# Patient Record
Sex: Female | Born: 1950 | Race: White | Hispanic: No | Smoking: Former smoker
Health system: Southern US, Community
[De-identification: ages and names within clinical notes are randomized; demographics above are authoritative.]

## PROBLEM LIST (undated history)

## (undated) DIAGNOSIS — J189 Pneumonia, unspecified organism: Secondary | ICD-10-CM

## (undated) DIAGNOSIS — E7800 Pure hypercholesterolemia, unspecified: Secondary | ICD-10-CM

## (undated) DIAGNOSIS — R51 Headache: Secondary | ICD-10-CM

## (undated) DIAGNOSIS — Z860100 Personal history of colon polyps, unspecified: Secondary | ICD-10-CM

## (undated) DIAGNOSIS — E785 Hyperlipidemia, unspecified: Secondary | ICD-10-CM

## (undated) DIAGNOSIS — M109 Gout, unspecified: Secondary | ICD-10-CM

## (undated) DIAGNOSIS — M940 Chondrocostal junction syndrome [Tietze]: Secondary | ICD-10-CM

## (undated) DIAGNOSIS — G588 Other specified mononeuropathies: Secondary | ICD-10-CM

## (undated) DIAGNOSIS — K5730 Diverticulosis of large intestine without perforation or abscess without bleeding: Secondary | ICD-10-CM

## (undated) DIAGNOSIS — T7840XA Allergy, unspecified, initial encounter: Secondary | ICD-10-CM

## (undated) DIAGNOSIS — K219 Gastro-esophageal reflux disease without esophagitis: Secondary | ICD-10-CM

## (undated) DIAGNOSIS — I10 Essential (primary) hypertension: Secondary | ICD-10-CM

## (undated) DIAGNOSIS — R7301 Impaired fasting glucose: Secondary | ICD-10-CM

## (undated) DIAGNOSIS — R7303 Prediabetes: Secondary | ICD-10-CM

## (undated) HISTORY — DX: Hyperlipidemia, unspecified: E78.5

## (undated) HISTORY — DX: Gout, unspecified: M10.9

## (undated) HISTORY — DX: Essential (primary) hypertension: I10

## (undated) HISTORY — DX: Pure hypercholesterolemia, unspecified: E78.00

## (undated) HISTORY — DX: Diverticulosis of large intestine without perforation or abscess without bleeding: K57.30

## (undated) HISTORY — DX: Allergy, unspecified, initial encounter: T78.40XA

## (undated) HISTORY — DX: Chondrocostal junction syndrome (tietze): M94.0

## (undated) HISTORY — DX: Morbid (severe) obesity due to excess calories: E66.01

## (undated) HISTORY — PX: CATARACT EXTRACTION, BILATERAL: SHX1313

## (undated) HISTORY — PX: TONSILLECTOMY: SUR1361

## (undated) HISTORY — PX: COLONOSCOPY: SHX174

## (undated) HISTORY — PX: LAPAROSCOPIC GASTRIC BANDING: SHX1100

## (undated) HISTORY — DX: Impaired fasting glucose: R73.01

## (undated) HISTORY — DX: Prediabetes: R73.03

## (undated) HISTORY — DX: Personal history of colon polyps, unspecified: Z86.0100

## (undated) HISTORY — PX: ANKLE FRACTURE SURGERY: SHX122

## (undated) HISTORY — DX: Other specified mononeuropathies: G58.8

---

## 1998-05-10 ENCOUNTER — Other Ambulatory Visit: Admission: RE | Admit: 1998-05-10 | Discharge: 1998-05-10 | Payer: Self-pay | Admitting: Obstetrics and Gynecology

## 1999-05-27 ENCOUNTER — Other Ambulatory Visit: Admission: RE | Admit: 1999-05-27 | Discharge: 1999-05-27 | Payer: Self-pay | Admitting: Obstetrics and Gynecology

## 1999-11-18 ENCOUNTER — Ambulatory Visit (HOSPITAL_COMMUNITY): Admission: RE | Admit: 1999-11-18 | Discharge: 1999-11-18 | Payer: Self-pay | Admitting: Gastroenterology

## 2000-06-02 ENCOUNTER — Other Ambulatory Visit: Admission: RE | Admit: 2000-06-02 | Discharge: 2000-06-02 | Payer: Self-pay | Admitting: Obstetrics and Gynecology

## 2001-06-07 ENCOUNTER — Other Ambulatory Visit: Admission: RE | Admit: 2001-06-07 | Discharge: 2001-06-07 | Payer: Self-pay | Admitting: Obstetrics and Gynecology

## 2002-07-11 ENCOUNTER — Other Ambulatory Visit: Admission: RE | Admit: 2002-07-11 | Discharge: 2002-07-11 | Payer: Self-pay | Admitting: Obstetrics and Gynecology

## 2002-10-01 ENCOUNTER — Encounter: Payer: Self-pay | Admitting: Orthopaedic Surgery

## 2002-10-01 ENCOUNTER — Inpatient Hospital Stay (HOSPITAL_COMMUNITY): Admission: EM | Admit: 2002-10-01 | Discharge: 2002-10-05 | Payer: Self-pay

## 2005-09-03 ENCOUNTER — Ambulatory Visit: Payer: Self-pay | Admitting: Pain Medicine

## 2006-12-21 ENCOUNTER — Ambulatory Visit (HOSPITAL_COMMUNITY): Admission: RE | Admit: 2006-12-21 | Discharge: 2006-12-21 | Payer: Self-pay | Admitting: Surgery

## 2006-12-23 ENCOUNTER — Encounter: Admission: RE | Admit: 2006-12-23 | Discharge: 2007-03-09 | Payer: Self-pay | Admitting: Surgery

## 2007-01-18 ENCOUNTER — Ambulatory Visit (HOSPITAL_COMMUNITY): Admission: RE | Admit: 2007-01-18 | Discharge: 2007-01-18 | Payer: Self-pay | Admitting: Surgery

## 2007-02-22 ENCOUNTER — Ambulatory Visit (HOSPITAL_COMMUNITY): Admission: RE | Admit: 2007-02-22 | Discharge: 2007-02-23 | Payer: Self-pay | Admitting: Surgery

## 2007-04-15 ENCOUNTER — Encounter: Admission: RE | Admit: 2007-04-15 | Discharge: 2007-06-09 | Payer: Self-pay | Admitting: Surgery

## 2007-09-22 ENCOUNTER — Encounter: Admission: RE | Admit: 2007-09-22 | Discharge: 2007-09-22 | Payer: Self-pay | Admitting: Obstetrics and Gynecology

## 2007-10-06 ENCOUNTER — Encounter: Admission: RE | Admit: 2007-10-06 | Discharge: 2007-10-06 | Payer: Self-pay | Admitting: Obstetrics and Gynecology

## 2007-10-06 ENCOUNTER — Other Ambulatory Visit: Admission: RE | Admit: 2007-10-06 | Discharge: 2007-10-06 | Payer: Self-pay | Admitting: Interventional Radiology

## 2007-10-06 ENCOUNTER — Encounter (INDEPENDENT_AMBULATORY_CARE_PROVIDER_SITE_OTHER): Payer: Self-pay | Admitting: Interventional Radiology

## 2007-11-22 ENCOUNTER — Encounter (INDEPENDENT_AMBULATORY_CARE_PROVIDER_SITE_OTHER): Payer: Self-pay | Admitting: Surgery

## 2007-11-22 ENCOUNTER — Ambulatory Visit (HOSPITAL_BASED_OUTPATIENT_CLINIC_OR_DEPARTMENT_OTHER): Admission: RE | Admit: 2007-11-22 | Discharge: 2007-11-22 | Payer: Self-pay | Admitting: Surgery

## 2007-12-15 ENCOUNTER — Encounter: Admission: RE | Admit: 2007-12-15 | Discharge: 2008-02-16 | Payer: Self-pay | Admitting: Surgery

## 2008-04-13 ENCOUNTER — Encounter: Admission: RE | Admit: 2008-04-13 | Discharge: 2008-04-13 | Payer: Self-pay | Admitting: Surgery

## 2010-06-18 NOTE — Op Note (Signed)
NAMECARMENCITA, [name]           ACCOUNT NO.:  [id number]   MEDICAL RECORD NO.:  [id number]          PATIENT TYPE:  OIB   LOCATION:  1534                         FACILITY:  [facility]   PHYSICIAN:  Thornton Park. Daphine Deutscher, MD  DATE OF BIRTH:  1950-06-07   DATE OF PROCEDURE:  02/22/2007  DATE OF DISCHARGE:                               OPERATIVE REPORT   PREOPERATIVE DIAGNOSIS:  Morbid obesity, body mass index 42.   POSTOPERATIVE DIAGNOSIS:  Morbid obesity, body mass index 42.   PROCEDURE:  Lap band APS system (Allergan)   SURGEON:  Thornton Park. Daphine Deutscher, MD.   ASSISTANTSharlet Salina T. Hoxworth, M.D.   ANESTHESIA:  GET.   DESCRIPTION OF PROCEDURE:  Ms. Wehrenberg is a 60 year old white female  brought to OR #1 on February 22, 2007 and given general anesthesia.  The  abdomen was prepped with Techni-Care and draped sterilely.  I entered  the abdomen through the left upper quadrant using an OptiVu 0-degree/10-  mm scope and entered and insufflated without difficulty.  Once  insufflated the standard trocars were placed including the 15 in the  upper right midline.  The abdomen was surveyed.  No other hernias or  defects were noted.  The 5-mm opening was made in the upper midline and  the [facility] retractor was inserted.  Dissection began at the opening  of the gastrohepatic window identifying the right crus.  I went and took  down an area right on the left crus.  The band passer was passed from  the inferior port up and out without difficulty and the APS band was  introduced through the 15-mm port.  It was then threaded through the  band passer brought around, the sizing balloon was inserted, pulled back  and there was resistance and no evidence of a hiatal hernia.  With the  band with it in place I went ahead and snapped the band and it seemed to  accommodate things just fine.  The tubing was then removed.   The anterior plication of the band was then performed using interrupted  free sutures of  2-0 Surgidac and were secured with tie knots.  Three  such sutures were placed in and the resultant plication looked good.  The band moved easily and was in good position.   The tubing to the band was brought out through the inferior port on the  right and attached to the Port-A-Cath port.  This was then threaded into  the abdomen and was secured to the fascia with four 2-0 Prolene's in an  interrupted  fashion secured to the fascia.  The wounds were irrigated and all closed  with 4-0 Vicryl subcutaneously and subcuticularly.  They were all  injected with 0.5% Marcaine.  Benzoin and Steri-Strips were used on the  skin.  The patient tolerated the procedure well and was taken to  recovery room in satisfactory condition.      Thornton Park Daphine Deutscher, MD  Electronically Signed     MBM/MEDQ  D:  02/22/2007  T:  02/23/2007  Job:  [id number]   cc:   Oley Balm. Georgina Pillion, M.D.  Fax: [phone]   Cordelia Pen A. Rosalio Macadamia, M.D.  Fax: [phone]

## 2010-06-21 NOTE — Procedures (Signed)
[facility]  Patient:    Rachel Brewer, Rachel Brewer                    MRN: [id number] Proc. Date: 11/18/99 Attending:  Everardo All. Madilyn Fireman, M.D. CC:         Oley Balm. Georgina Pillion, M.D.   Procedure Report  PROCEDURE:  Colonoscopy.  ENDOSCOPIST:  Everardo All. Madilyn Fireman, M.D.  INDICATION FOR PROCEDURE:  Family history of colon cancer in a first degree relative.  DESCRIPTION OF PROCEDURE:  The patient was placed in the left lateral decubitus position and placed on the pulse monitor with continuous low-flow oxygen delivered by nasal cannula.  She was sedated with 80 mg IV Demerol and 7 mg IV Versed.  The Olympus video colonoscope was inserted into the rectum and advanced to the cecum, confirmed by transillumination of McBurneys point and visualization of the ileocecal valve and appendiceal orifice.  The prep was fair.  There were areas where the walls were coated with some semisolid stool that could not be adequately washed away and I could thus not rule out lesions smaller than 1 cm in all areas; otherwise, the cecum, ascending, transverse, descending and sigmoid colon all appeared normal with no masses, polyps, diverticula or other mucosa abnormalities.  The rectum likewise appeared normal and a retroflexed view of the anus revealed no obvious internal hemorrhoids.  The colonoscope was then withdrawn and the patient returned to the recovery room in stable condition.  She tolerated the procedure well and there were no immediate complications.  IMPRESSION:  Internal hemorrhoids; otherwise, normal colonoscopy with suboptimal prep.  PLAN:  Repeat colonoscopy in five years. DD:  11/18/99 TD:  11/18/99 Job: [id number]

## 2010-06-21 NOTE — Discharge Summary (Signed)
NAME:  Rachel Brewer, Rachel Brewer                     ACCOUNT NO.:  [id number]   MEDICAL RECORD NO.:  [id number]                   PATIENT TYPE:  INP   LOCATION:  5007                                 FACILITY:  [facility]   PHYSICIAN:  Lubertha Basque. Jerl Santos, M.D.             DATE OF BIRTH:  04-25-1950   DATE OF ADMISSION:  10/01/2002  DATE OF DISCHARGE:  10/05/2002                                 DISCHARGE SUMMARY   ADMISSION DIAGNOSIS:  1. Bimalleolar ankle fracture, right.  2. History of hypertension.   DISCHARGE DIAGNOSES:  1. Bimalleolar ankle fracture, right.  2. History of hypertension.   PROCEDURE:  Open reduction, internal fixation of right bimalleolar ankle  fracture.   HISTORY OF PRESENT ILLNESS:  Briefly, the patient is a 60 year old white  female patient well known to our practice who has been treated for other  problems.  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  She was  walking on wet slippery ground and fell, suffering an injury to her right  ankle.  She was seen in the emergency room in that area and we were  contacted by the emergency room physician with the x-ray findings of a  bimalleolar ankle fracture and she was transported down to [place] where we met  in the emergency room and discussed treatment options, that being open  reduction and internal fixation of her ankle.   LABORATORY DATA:  Pertinent laboratory and x-ray findings with normal sinus  rhythm on electrocardiogram.  X-rays showed bimalleolar ankle fracture  displacement.  Chest with no active disease.  On labs the white blood cell  count was 11.3, red blood cells 4.86, hemoglobin 15, hematocrit 45.  Urine  was essentially normal.   HOSPITAL COURSE:  The patient was admitted postoperatively and placed on a  variety of p.o. and IM analgesics, intravenous Ancef 1 gram q.8h. X3 doses.  She was non weight-bearing on the right side and physical therapy and  occupational therapy were ordered for gait  training and non weight-bearing  gait.  Special equipment was ordered as well for a walker and 3-in-1 chair,  possibly a wheelchair.  The patient was very slow going with physical  therapy.  Her pain was controlled eventually on oral pain medications.  Her  equipment was ordered and home health was arranged.  She was discharged  home.   CONDITION ON DISCHARGE:  Improved.   FOLLOW UP:  The patient will remain on her current medications which are  Cardura, Avalide, Neurontin, amitriptyline, calcium, vitamin A.  She was  given a prescription for Percocet for pain, one to two tablets q.4-6h. PRN.  She is instructed to ice and to elevate.  Diet is regular.  No dressing  changes.  She should leave the splint on.  She will return to our office  within 7 to 10 days postoperatively.     Lindwood Qua, P.A.  Lubertha Basque Jerl Santos, M.D.   MC/MEDQ  D:  10/25/2002  T:  10/26/2002  Job:  [id number]

## 2010-09-19 ENCOUNTER — Encounter (INDEPENDENT_AMBULATORY_CARE_PROVIDER_SITE_OTHER): Payer: Self-pay | Admitting: Surgery

## 2010-09-20 ENCOUNTER — Encounter (INDEPENDENT_AMBULATORY_CARE_PROVIDER_SITE_OTHER): Payer: Self-pay | Admitting: Surgery

## 2010-09-20 ENCOUNTER — Ambulatory Visit (INDEPENDENT_AMBULATORY_CARE_PROVIDER_SITE_OTHER): Payer: Federal, State, Local not specified - PPO | Admitting: Surgery

## 2010-09-20 ENCOUNTER — Telehealth (INDEPENDENT_AMBULATORY_CARE_PROVIDER_SITE_OTHER): Payer: Self-pay | Admitting: Surgery

## 2010-09-20 VITALS — BP 148/92 | Ht 69.0 in | Wt 279.2 lb

## 2010-09-20 DIAGNOSIS — Z9884 Bariatric surgery status: Secondary | ICD-10-CM

## 2010-09-20 DIAGNOSIS — Z4651 Encounter for fitting and adjustment of gastric lap band: Secondary | ICD-10-CM

## 2010-09-20 NOTE — Progress Notes (Signed)
Rachel Brewer comes in today after a fun summer including [place], [address], and the mountains. We took out 2 cc before she left for the summer.  Therefore went ahead and added back I will more than or 2 cc today. Dated the computer. She's ready get back to try to lose weight again. We'll see her back in 6 weeks sooner if she wants to lose a little quarter.postop

## 2010-10-24 LAB — BASIC METABOLIC PANEL
BUN: 15
Calcium: 9.4
GFR calc non Af Amer: >60
Glucose, Bld: 102 — ABNORMAL HIGH

## 2010-10-24 LAB — CBC
Platelets: 295
RDW: 13.3

## 2010-10-24 LAB — DIFFERENTIAL
Basophils Absolute: 0
Lymphocytes Relative: 13
Neutro Abs: 7.9 — ABNORMAL HIGH
Neutrophils Relative %: 81 — ABNORMAL HIGH

## 2010-10-24 LAB — HEMOGLOBIN AND HEMATOCRIT, BLOOD
HCT: 42.8
Hemoglobin: 14.9

## 2010-11-14 ENCOUNTER — Ambulatory Visit (INDEPENDENT_AMBULATORY_CARE_PROVIDER_SITE_OTHER): Payer: Federal, State, Local not specified - PPO | Admitting: Surgery

## 2010-11-14 DIAGNOSIS — Z9884 Bariatric surgery status: Secondary | ICD-10-CM

## 2010-11-14 NOTE — Progress Notes (Signed)
Rachel Brewer comes in today in followup. She feels restricted and has lost almost 9 pounds since the last visit. She does have challenges with carbohydrates that her put her. We talked for wall about diet and about work. I'll see her back in 8 weeks in followup. She did not need a fill today.

## 2010-12-31 ENCOUNTER — Other Ambulatory Visit: Payer: Self-pay | Admitting: Dermatology

## 2011-02-13 ENCOUNTER — Encounter (INDEPENDENT_AMBULATORY_CARE_PROVIDER_SITE_OTHER): Payer: Self-pay | Admitting: Surgery

## 2011-02-13 ENCOUNTER — Ambulatory Visit (INDEPENDENT_AMBULATORY_CARE_PROVIDER_SITE_OTHER): Payer: Federal, State, Local not specified - PPO | Admitting: Surgery

## 2011-02-13 VITALS — BP 142/96 | HR 64 | Temp 97.1°F | Resp 18 | Ht 69.0 in | Wt 278.8 lb

## 2011-02-13 DIAGNOSIS — Z9884 Bariatric surgery status: Secondary | ICD-10-CM

## 2011-02-13 NOTE — Progress Notes (Signed)
Rachel Brewer Body mass index is 41.17 kg/(m^2).  Having regurgitation:  no  Nocturnal reflux?  no  Amount of fill  0.25  Ate  Sweets over the holidays

## 2011-02-13 NOTE — Patient Instructions (Signed)

## 2011-04-10 ENCOUNTER — Encounter (INDEPENDENT_AMBULATORY_CARE_PROVIDER_SITE_OTHER): Payer: Self-pay | Admitting: Surgery

## 2011-04-10 ENCOUNTER — Ambulatory Visit (INDEPENDENT_AMBULATORY_CARE_PROVIDER_SITE_OTHER): Payer: Federal, State, Local not specified - PPO | Admitting: Surgery

## 2011-04-10 VITALS — BP 132/90 | HR 72 | Temp 98.2°F | Resp 16 | Ht 69.0 in | Wt 275.4 lb

## 2011-04-10 DIAGNOSIS — Z9884 Bariatric surgery status: Secondary | ICD-10-CM

## 2011-04-10 NOTE — Progress Notes (Signed)
Rachel Brewer comes in today having reflux. Previously bed 0.25 cc to her band. Today I removed 0.3 cc from her band. I also suggested she go on omega 3 fatty acids. We talked about a lot of the aches and pains that she has her arthritis. 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 She'll says very high arches in her feet and as a result has a lot of pain in her feet.  I will see her again in 6-8 weeks. If she still having reflux she is to call [place] Ana  next week and I'll see her in sooner.

## 2011-06-06 ENCOUNTER — Encounter (INDEPENDENT_AMBULATORY_CARE_PROVIDER_SITE_OTHER): Payer: Federal, State, Local not specified - PPO | Admitting: Surgery

## 2012-05-20 ENCOUNTER — Encounter (INDEPENDENT_AMBULATORY_CARE_PROVIDER_SITE_OTHER): Payer: Self-pay

## 2012-05-20 ENCOUNTER — Ambulatory Visit (INDEPENDENT_AMBULATORY_CARE_PROVIDER_SITE_OTHER): Payer: Federal, State, Local not specified - PPO | Admitting: Physician Assistant

## 2012-05-20 VITALS — BP 116/78 | HR 76 | Resp 14 | Ht 69.0 in | Wt 284.2 lb

## 2012-05-20 DIAGNOSIS — Z4651 Encounter for fitting and adjustment of gastric lap band: Secondary | ICD-10-CM

## 2012-05-20 NOTE — Progress Notes (Signed)
  HISTORY: Rachel Brewer is a 62 y.o.female who received an AP-Standard lap-band in January 2009 by Dr. Daphine Deutscher. She comes in today with frustration over her lack of success thus far. She voices a desire to just be able to enjoy a meal with out fear of becoming obstructed. She also complains of nocturnal reflux at least once a month, which she says is far too frequent for her. She is asking to have all fluid removed as a 'break'.  VITAL SIGNS: Filed Vitals:   05/20/12 1226  BP: 116/78  Pulse: 76  Resp: 14    PHYSICAL EXAM: Physical exam reveals a very well-appearing 62 y.o.female in no apparent distress Neurologic: Awake, alert, oriented Psych: Bright affect, conversant Respiratory: Breathing even and unlabored. No stridor or wheezing Abdomen: Soft, nontender, nondistended to palpation. Incisions well-healed. No incisional hernias. Port easily palpated. Extremities: Atraumatic, good range of motion.  ASSESMENT: 62 y.o.  female  s/p AP-Standard lap-band.   PLAN: The patient's port was accessed with a 20G Huber needle without difficulty. Clear fluid was aspirated and 5 mL saline was removed from the port to give a total predicted volume of 0 mL. The patient was advised to concentrate on healthy food choices and to avoid slider foods high in fats and carbohydrates.

## 2012-05-20 NOTE — Patient Instructions (Signed)
Return as needed. Focus on good food choices as well as physical activity. Return sooner if you have an increase in hunger, portion sizes or weight. Return also for difficulty swallowing, night cough, reflux.   

## 2012-10-21 ENCOUNTER — Ambulatory Visit
Admission: RE | Admit: 2012-10-21 | Discharge: 2012-10-21 | Disposition: A | Discharge status: Home / Self Care | Payer: Federal, State, Local not specified - PPO | Source: Ambulatory Visit | Attending: Family Medicine | Admitting: Family Medicine

## 2012-10-21 ENCOUNTER — Other Ambulatory Visit: Payer: Self-pay | Admitting: Family Medicine

## 2012-10-21 DIAGNOSIS — R05 Cough: Secondary | ICD-10-CM

## 2012-11-26 ENCOUNTER — Other Ambulatory Visit: Payer: Self-pay | Admitting: Family Medicine

## 2012-11-26 ENCOUNTER — Ambulatory Visit
Admission: RE | Admit: 2012-11-26 | Discharge: 2012-11-26 | Disposition: A | Discharge status: Home / Self Care | Payer: Federal, State, Local not specified - PPO | Source: Ambulatory Visit | Attending: Family Medicine | Admitting: Family Medicine

## 2012-11-26 DIAGNOSIS — R05 Cough: Secondary | ICD-10-CM

## 2012-12-06 ENCOUNTER — Encounter: Payer: Self-pay | Admitting: Internal Medicine

## 2012-12-06 ENCOUNTER — Ambulatory Visit (INDEPENDENT_AMBULATORY_CARE_PROVIDER_SITE_OTHER): Payer: Federal, State, Local not specified - PPO | Admitting: Internal Medicine

## 2012-12-06 VITALS — BP 136/84 | HR 98 | Ht 68.75 in | Wt 250.0 lb

## 2012-12-06 DIAGNOSIS — J209 Acute bronchitis, unspecified: Secondary | ICD-10-CM

## 2012-12-06 DIAGNOSIS — J189 Pneumonia, unspecified organism: Secondary | ICD-10-CM

## 2012-12-06 NOTE — Patient Instructions (Addendum)
Order- future CXR to be done at [facility]  In one month,       Dx pneumonia  It will be good to stand, walk and take deep breaths to help your lung expand.  You may use Mucinex if needed to help clear mucus- around 1200 mg/ day  I think you can follow-up with your primary physicians, but we will be happy to see you again as needed.  Once you are done with this illness- around the time of the next CXR - Your doctors can give you a pneumonia vaccine r

## 2012-12-06 NOTE — Progress Notes (Signed)
12/06/12- 7 yoF former smoker referred by Dr Docia Chuck for cough and shortness of breath Had smoked one pack per day for 13 years, ending in 1986. Complains of cough or shortness of breath starting in August of 2014. Had come back from vacation. Husband had sore throat that time but resolved. She got a sore throat than rapid progression with fever and chills, malaise and weakness. She was treated by her primary physician service with doxycycline and then with a Z-Pak and prednisone. She improved but cough remained productive. She then saw Dr. Kevan Ny September 18 with increased prednisone taper. Chest x-ray report from 10/21/2012 describes interval development of airspace and linear density in the left base consistent with subsegmental atelectasis or pneumonia. She was given a third Z-Pak. Cough remains productive but clear. October 24 she started a fourth Z-Pak plus Augmentin For 7 days resulting in GI upset which has slowly improved. He doxycycline earlier in the course had upset her stomach. Followup chest x-ray 11/26/2012: Persistent mild hazy left lung base opacity. "Given the lack of change this is most likely atelectasis".she still has some cough but no longer short of breath, chest pain fever. There has been no blood. Has had flu vaccine. She denies reflux or any history of DVT/PE. 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  Prior to Admission medications   Medication Sig Start Date End Date Taking? Authorizing Provider  Cholecalciferol (VITAMIN D PO) Take 2,000 mg by mouth daily.   Yes Historical Provider, MD  Cyanocobalamin (VITAMIN B-12) 1000 MCG SUBL Place 1 tablet under the tongue daily.   Yes Historical Provider, MD  doxazosin (CARDURA) 4 MG tablet Take 4 mg by mouth at bedtime.   Yes Historical Provider, MD  fexofenadine (ALLEGRA) 180 MG tablet Take 180 mg by mouth daily.   Yes Historical Provider, MD  fluticasone (VERAMYST) 27.5 MCG/SPRAY nasal spray Place 2 sprays into  the nose daily.     Yes Historical Provider, MD  losartan (COZAAR) 50 MG tablet Take 50 mg by mouth daily.   Yes Historical Provider, MD  metoprolol (TOPROL-XL) 50 MG 24 hr tablet Take 50 mg by mouth daily.   Yes Historical Provider, MD  nystatin cream (MYCOSTATIN) Ad lib. 01/01/11  Yes Historical Provider, MD   Earl Lagos Past Surgical History  Procedure Laterality Date  . Laparoscopic gastric banding    . Ankle fracture surgery      right - has screws and plate to reconstruct   Family History  Problem Relation Age of Onset  . Dementia Mother   . Cancer Father     colon  . Heart disease Father   . Allergies Sister   . Allergies Sister    History   Social History  . Marital Status: Married    Spouse Name: N/A    Number of Children: 0  . Years of Education: N/A   Occupational History  . teacher    Social History Main Topics  . Smoking status: Former Smoker -- 1.00 packs/day for 17 years    Types: Cigarettes    Quit date: 02/04/1983  . Smokeless tobacco: Not on file  . Alcohol Use: Yes     Comment: 2 glasses on occasion  . Drug Use: No  . Sexual Activity: Not on file   Other Topics Concern  . Not on file   Social History Narrative  . No narrative on file   ROS-see HPI Constitutional:   No-   weight loss, night  sweats, fevers, chills, fatigue, lassitude. HEENT:   No-  headaches, difficulty swallowing, tooth/dental problems, sore throat,       No-  sneezing, itching, ear ache, nasal congestion, post nasal drip,  CV:  No-   chest pain, orthopnea, PND, swelling in lower extremities, anasarca, dizziness, palpitations Resp: No-   shortness of breath with exertion or at rest.              + productive cough,  No non-productive cough,  No- coughing up of blood.              No-   change in color of mucus.  No- wheezing.   Skin: No-   rash or lesions. GI:  No-   heartburn, indigestion, abdominal pain, nausea, vomiting, diarrhea,                 change in bowel habits, loss of  appetite GU: No-   dysuria, change in color of urine, no urgency or frequency.  No- flank pain. MS:  No-   joint pain or swelling.  No- decreased range of motion.  No- back pain. Neuro-     nothing unusual Psych:  No- change in mood or affect. No depression or anxiety.  No memory loss.  OBJ- Physical Exam General- Alert, Oriented, Affect-appropriate, Distress- none acute Skin- rash-none, lesions- none, excoriation- none Lymphadenopathy- none Head- atraumatic            Eyes- Gross vision intact, PERRLA, conjunctivae and secretions clear            Ears- Hearing, canals-normal            Nose- Clear, no-Septal dev, mucus, polyps, erosion, perforation             Throat- Mallampati II , mucosa clear , drainage- none, tonsils- atrophic Neck- flexible , trachea midline, no stridor , thyroid nl, carotid no bruit Chest - symmetrical excursion , unlabored           Heart/CV- RRR , no murmur , no gallop  , no rub, nl s1 s2                           - JVD- none , edema- none, stasis changes- none, varices- none           Lung- clear to P&A, wheeze- none, cough- none , dullness-none, rub- none           Chest wall-  Abd- tender-no, distended-no, bowel sounds-present, HSM- no Br/ Gen/ Rectal- Not done, not indicated Extrem- cyanosis- none, clubbing, none, atrophy- none, strength- nl Neuro- grossly intact to observation

## 2012-12-20 DIAGNOSIS — J209 Acute bronchitis, unspecified: Secondary | ICD-10-CM | POA: Insufficient documentation

## 2012-12-20 NOTE — Assessment & Plan Note (Signed)
It sounds as if this began with a viral illness that may have progressed to include low grade pneumonia which is now resolved. Unclear how much underlying lung disease she has from her years of smoking . Plan- repeat chest x-ray to be scheduled.  To avoid further antibiotics for now and I discussed evaluation for C diff if diarrhea is a problem. Encouraged walking and deep breathing. Consider Mucinex

## 2013-01-05 ENCOUNTER — Ambulatory Visit
Admission: RE | Admit: 2013-01-05 | Discharge: 2013-01-05 | Disposition: A | Discharge status: Home / Self Care | Payer: Federal, State, Local not specified - PPO | Source: Ambulatory Visit | Attending: Internal Medicine | Admitting: Internal Medicine

## 2013-01-05 DIAGNOSIS — J189 Pneumonia, unspecified organism: Secondary | ICD-10-CM

## 2013-02-14 ENCOUNTER — Telehealth: Payer: Self-pay | Admitting: Internal Medicine

## 2013-02-14 MED ORDER — AZITHROMYCIN 250 MG PO TABS: ORAL_TABLET | ORAL | Status: DC

## 2013-02-14 MED ORDER — DOXYCYCLINE HYCLATE 100 MG PO TABS: ORAL_TABLET | ORAL | Status: DC

## 2013-02-14 NOTE — Telephone Encounter (Signed)
Ok Zpak

## 2013-02-14 NOTE — Telephone Encounter (Signed)
Per CY-give Doxycycline 100 mg #8 take 2 today then 1 daily until gone no refills. Thanks.

## 2013-02-14 NOTE — Telephone Encounter (Signed)
Called, spoke with pt.  C/o prod cough with a little mucus - mostly clear, wheezing, and labored breathing at rest and with activity x 3 days.  No chest tightness, chest pain, or f/c/s.  Is using mucinex 600mg  bid.  Pt is afraid this may go into pna.  Requesting OV today - if not able to be worked in, ok with rx being sent in.  Dr. Annamaria Boots, pls advise.  Thank you.  Last OV with CY: 12/06/12; f/u prn  [facility]  Allergies verified with pt: Allergies  Allergen Reactions  . Fluconazole In Dextrose Hives    All over the body  . Norvasc [Amlodipine Besylate] Other (See Comments)    Edema of ankles  . Clindamycin Hcl Rash  . Zestril [Lisinopril] Cough    Current Meds: Current Outpatient Prescriptions on File Prior to Visit  Medication Sig Dispense Refill  . Cholecalciferol (VITAMIN D PO) Take 2,000 mg by mouth daily.      . Cyanocobalamin (VITAMIN B-12) 1000 MCG SUBL Place 1 tablet under the tongue daily.      Marland Kitchen doxazosin (CARDURA) 4 MG tablet Take 4 mg by mouth at bedtime.      . fexofenadine (ALLEGRA) 180 MG tablet Take 180 mg by mouth daily.      . fluticasone (VERAMYST) 27.5 MCG/SPRAY nasal spray Place 2 sprays into the nose daily.        Marland Kitchen losartan (COZAAR) 50 MG tablet Take 50 mg by mouth daily.      . metoprolol (TOPROL-XL) 50 MG 24 hr tablet Take 50 mg by mouth daily.      Marland Kitchen nystatin cream (MYCOSTATIN) Ad lib.       No current facility-administered medications on file prior to visit.

## 2013-02-14 NOTE — Telephone Encounter (Signed)
Pt calling again in ref to previous msg.Rachel Brewer ° ° °

## 2013-02-14 NOTE — Telephone Encounter (Signed)
Spoke to the pt and she states she cannot take doxycycline it makes her feel terrible. She states she prefers to take a zpak. Doxy added to allergy list. Please advise. Stanton Bing, CMA

## 2013-02-14 NOTE — Telephone Encounter (Signed)
Zpak sent to [facility].  Pt aware and is to call back if symptoms do not improve or worsen.

## 2013-03-07 ENCOUNTER — Ambulatory Visit
Admission: RE | Admit: 2013-03-07 | Discharge: 2013-03-07 | Disposition: A | Discharge status: Home / Self Care | Payer: Federal, State, Local not specified - PPO | Source: Ambulatory Visit | Attending: Internal Medicine | Admitting: Internal Medicine

## 2013-03-07 ENCOUNTER — Telehealth: Payer: Self-pay | Admitting: Internal Medicine

## 2013-03-07 DIAGNOSIS — R053 Chronic cough: Secondary | ICD-10-CM

## 2013-03-07 DIAGNOSIS — R05 Cough: Secondary | ICD-10-CM

## 2013-03-07 NOTE — Telephone Encounter (Signed)
Called and spoke with pt. She reports she has been taking mucinex since last OV 12/06/12. She is still having some chest congestion. The cough is not as bad now. She will cough when it is cold outside and then around 8pm every night she will start coughing. She will get up plae green colored phlem. The cough does not keep her awake. She wants to know if she should have a CXR done to make sure she is getting better from last cxr and not worse. Please advise Dr. Annamaria Boots thanks No pending appt Allergies  Allergen Reactions  . Fluconazole In Dextrose Hives    All over the body  . Doxycycline     "makes me feel terrible"  . Norvasc [Amlodipine Besylate] Other (See Comments)    Edema of ankles  . Clindamycin Hcl Rash  . Zestril [Lisinopril] Cough

## 2013-03-07 NOTE — Telephone Encounter (Signed)
Called and spoke with pt. She wants to see what the CXR shows first before starting inhaler. She needed nothing further.

## 2013-03-07 NOTE — Telephone Encounter (Signed)
Order CXR  Dx chronic cough  Offer to teach sample Breo ellipta    1 puff, then rinse mouth, once daily

## 2013-03-10 ENCOUNTER — Telehealth: Payer: Self-pay | Admitting: Internal Medicine

## 2013-03-10 MED ORDER — FLUTICASONE FUROATE-VILANTEROL 100-25 MCG/INH IN AEPB: 1.0000 | INHALATION_SPRAY | Freq: Every day | RESPIRATORY_TRACT | Status: DC

## 2013-03-10 NOTE — Telephone Encounter (Signed)
Suggest we start her with Breo Ellipta sample  1 puff and rinse mouth, one time daily  Needs ROV back with me or TP

## 2013-03-10 NOTE — Telephone Encounter (Signed)
Last OV 12-06-12. I spoke with the pt and advised of results. She states understanding but states she is still having a productive cough. She states the phlegm is mostly clear, but that she still has a lot of chest congestion and rattling in her chest. Pt states she has been taking mucinex since November. She is concerned that she is still having the cough and congestion. She wants to know is this normal, will she always have this, or should she be improving by now. Please advise. Lynn Bing, CMA Allergies  Allergen Reactions  . Fluconazole In Dextrose Hives    All over the body  . Doxycycline     "makes me feel terrible"  . Norvasc [Amlodipine Besylate] Other (See Comments)    Edema of ankles  . Clindamycin Hcl Rash  . Zestril [Lisinopril] Cough

## 2013-03-10 NOTE — Telephone Encounter (Signed)
Spoke with the pt and notified of recs per CDY  She verbalized understanding  OV with TP for 03/18/13 and sample of breo up front for p/u  I advised that she ask for nurse when she comes in to instruct on use  Pt verbalized understanding and states nothing further needed

## 2013-03-10 NOTE — Telephone Encounter (Signed)
Returning call can be reached at [phone].Elnita Maxwell

## 2013-03-10 NOTE — Telephone Encounter (Signed)
lmomtcb x1 

## 2013-03-10 NOTE — Telephone Encounter (Signed)
Notes Recorded by Deneise Lever, MD on 03/08/2013 at 1:26 PM CXR- some crowded markings but no active process or concern.  lmtcb x1

## 2013-03-18 ENCOUNTER — Encounter: Payer: Self-pay | Admitting: Adult Health

## 2013-03-18 ENCOUNTER — Ambulatory Visit (INDEPENDENT_AMBULATORY_CARE_PROVIDER_SITE_OTHER): Payer: Federal, State, Local not specified - PPO | Admitting: Adult Health

## 2013-03-18 VITALS — BP 116/76 | HR 68 | Temp 97.9°F

## 2013-03-18 DIAGNOSIS — R053 Chronic cough: Secondary | ICD-10-CM | POA: Insufficient documentation

## 2013-03-18 DIAGNOSIS — R05 Cough: Secondary | ICD-10-CM

## 2013-03-18 DIAGNOSIS — R059 Cough, unspecified: Secondary | ICD-10-CM

## 2013-03-18 MED ORDER — PREDNISONE 10 MG PO TABS: ORAL_TABLET | ORAL | Status: DC

## 2013-03-18 MED ORDER — HYDROCODONE-HOMATROPINE 5-1.5 MG/5ML PO SYRP: 5.0000 mL | ORAL_SOLUTION | Freq: Four times a day (QID) | ORAL | Status: DC | PRN

## 2013-03-18 NOTE — Patient Instructions (Signed)
Prednisone taper over next week.  Mucinex Twice daily  As needed  Cough/congestion  Add Chlortabs 4mg  2 At bedtime   Delsym 2 tsp Twice daily  For cough  Hydromet 1-2 tsp every 4-6hr As needed  Cough, may make you sleepy.  Prilosec 20mg  daily before meal .  GOAL IS NO COUGHING OR THROAT CLEARING Voice rest , sips of water may help. NO MINTS  May hold BREO for now .  Follow up Dr. Annamaria Boots  3 weeks and As needed   Please contact office for sooner follow up if symptoms do not improve or worsen or seek emergency care

## 2013-03-18 NOTE — Assessment & Plan Note (Signed)
Cyclical cough s/p PNA 06/4654  Cxr shows residual atx in lingula - may need CT chest if cough not improving Will treat for cough triggers along with cough suppression regimen  Brief steroid trial  Hold BREO as no benefit and may make cough worse.  If not improving may need PFT however coughing so much doubt would be good test .   Plan  Prednisone taper over next week.  Mucinex Twice daily  As needed  Cough/congestion  Add Chlortabs 4mg  2 At bedtime   Delsym 2 tsp Twice daily  For cough  Hydromet 1-2 tsp every 4-6hr As needed  Cough, may make you sleepy.  Prilosec 20mg  daily before meal .  GOAL IS NO COUGHING OR THROAT CLEARING Voice rest , sips of water may help. NO MINTS  May hold BREO for now .  Follow up Dr. Annamaria Boots  3 weeks and As needed   Please contact office for sooner follow up if symptoms do not improve or worsen or seek emergency care

## 2013-03-18 NOTE — Progress Notes (Signed)
12/06/12- 30 yoF former smoker referred by Dr Dorthy Cooler for cough and shortness of breath Had smoked one pack per day for 13 years, ending in 1986. Complains of cough or shortness of breath starting in August of 2014. Had come back from vacation. Husband had sore throat that time but resolved. She got a sore throat than rapid progression with fever and chills, malaise and weakness. She was treated by her primary physician service with doxycycline and then with a Z-Pak and prednisone. She improved but cough remained productive. She then saw Dr. Inda Merlin September 18 with increased prednisone taper. Chest x-ray report from 10/21/2012 describes interval development of airspace and linear density in the left base consistent with subsegmental atelectasis or pneumonia. She was given a third Z-Pak. Cough remains productive but clear. October 24 she started a fourth Z-Pak plus Augmentin For 7 days resulting in GI upset which has slowly improved. He doxycycline earlier in the course had upset her stomach. Followup chest x-ray 11/26/2012: Persistent mild hazy left lung base opacity. "Given the lack of change this is most likely atelectasis".she still has some cough but no longer short of breath, chest pain fever. There has been no blood. Has had flu vaccine. She denies reflux or any history of DVT/PE. 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   03/18/2013 Acute OV  Pt of Dr. Annamaria Boots  .  Complains of  prod cough with light green/beige mucus, sore throat from cough, rattling in chest at night since Aug 2014.  Began Breo on 2/5 with minimal improvement beginning within the last 2 days.  Denies increased SOB, tightness, hemoptysis, nausea, vomiting, edema.  CXR 03/07/13  discoid atx in lingula .  No pets, no recent travel . No known occupational exposure.  Worse first thing in am and At bedtime   No cough during sleep.  Worse with laughing , talking and cold air.  No chest pain,orthopnea, edema or  fever.            ROS-see HPI Constitutional:   No-   weight loss, night sweats, fevers, chills, fatigue, lassitude. HEENT:   No-  headaches, difficulty swallowing, tooth/dental problems, sore throat,       No-  sneezing, itching, ear ache, nasal congestion, post nasal drip,  CV:  No-   chest pain, orthopnea, PND, swelling in lower extremities, anasarca, dizziness, palpitations Resp: No-   shortness of breath with exertion or at rest.              + productive cough,  No non-productive cough,  No- coughing up of blood.              No-   change in color of mucus.  No- wheezing.   Skin: No-   rash or lesions. GI:  No-   heartburn, indigestion, abdominal pain, nausea, vomiting, diarrhea,                 change in bowel habits, loss of appetite GU: No-   dysuria, change in color of urine, no urgency or frequency.  No- flank pain. MS:  No-   joint pain or swelling.  No- decreased range of motion.  No- back pain. Neuro-     nothing unusual Psych:  No- change in mood or affect. No depression or anxiety.  No memory loss.  OBJ- Physical Exam GEN: A/Ox3; pleasant , NAD, well nourished , harsh barking cough and throat clearing  HEENT:  [place]/AT,  EACs-clear, TMs-wnl,  NOSE-clear, THROAT-clear, no lesions, no postnasal drip or exudate noted.   NECK:  Supple w/ fair ROM; no JVD; normal carotid impulses w/o bruits; no thyromegaly or nodules palpated; no lymphadenopathy.  RESP  Clear  P & A; w/o, wheezes/ rales/ or rhonchi.no accessory muscle use, no dullness to percussion  CARD:  RRR, no m/r/g  , no peripheral edema, pulses intact, no cyanosis or clubbing.  GI:   Soft & nt; nml bowel sounds; no organomegaly or masses detected.  Musco: Warm bil, no deformities or joint swelling noted.   Neuro: alert, no focal deficits noted.    Skin: Warm, no lesions or rashes

## 2013-04-15 ENCOUNTER — Encounter: Payer: Self-pay | Admitting: Internal Medicine

## 2013-04-15 ENCOUNTER — Ambulatory Visit (INDEPENDENT_AMBULATORY_CARE_PROVIDER_SITE_OTHER): Payer: Federal, State, Local not specified - PPO | Admitting: Internal Medicine

## 2013-04-15 ENCOUNTER — Other Ambulatory Visit (INDEPENDENT_AMBULATORY_CARE_PROVIDER_SITE_OTHER): Payer: Federal, State, Local not specified - PPO

## 2013-04-15 VITALS — BP 120/84 | HR 82 | Ht 68.25 in | Wt 250.0 lb

## 2013-04-15 DIAGNOSIS — J209 Acute bronchitis, unspecified: Secondary | ICD-10-CM

## 2013-04-15 DIAGNOSIS — J479 Bronchiectasis, uncomplicated: Secondary | ICD-10-CM

## 2013-04-15 LAB — BASIC METABOLIC PANEL
BUN: 15 mg/dL (ref 6–23)
CO2: 24 mEq/L (ref 19–32)
CREATININE: 0.9 mg/dL (ref 0.4–1.2)
Calcium: 9.2 mg/dL (ref 8.4–10.5)
Chloride: 105 mEq/L (ref 96–112)
GFR: 71.56 mL/min (ref >60.00)
GLUCOSE: 94 mg/dL (ref 70–99)
POTASSIUM: 3.9 meq/L (ref 3.5–5.1)
Sodium: 138 mEq/L (ref 135–145)

## 2013-04-15 MED ORDER — HYDROCODONE-HOMATROPINE 5-1.5 MG/5ML PO SYRP: 5.0000 mL | ORAL_SOLUTION | Freq: Four times a day (QID) | ORAL | Status: DC | PRN

## 2013-04-15 NOTE — Progress Notes (Signed)
12/06/12- 60 yoF former smoker referred by Dr Dorthy Cooler for cough and shortness of breath Had smoked one pack per day for 13 years, ending in 1986. Complains of cough or shortness of breath starting in August of 2014. Had come back from vacation. Husband had sore throat that time but resolved. She got a sore throat than rapid progression with fever and chills, malaise and weakness. She was treated by her primary physician service with doxycycline and then with a Z-Pak and prednisone. She improved but cough remained productive. She then saw Dr. Inda Merlin September 18 with increased prednisone taper. Chest x-ray report from 10/21/2012 describes interval development of airspace and linear density in the left base consistent with subsegmental atelectasis or pneumonia. She was given a third Z-Pak. Cough remains productive but clear. October 24 she started a fourth Z-Pak plus Augmentin For 7 days resulting in GI upset which has slowly improved. He doxycycline earlier in the course had upset her stomach. Followup chest x-ray 11/26/2012: Persistent mild hazy left lung base opacity. "Given the lack of change this is most likely atelectasis".she still has some cough but no longer short of breath, chest pain fever. There has been no blood. Has had flu vaccine. She denies reflux or any history of DVT/PE. 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   03/18/2013 Acute OV  Pt of Dr. Annamaria Boots  .  Complains of  prod cough with light green/beige mucus, sore throat from cough, rattling in chest at night since Aug 2014.  Began Breo on 2/5 with minimal improvement beginning within the last 2 days.  Denies increased SOB, tightness, hemoptysis, nausea, vomiting, edema.  CXR 03/07/13  discoid atx in lingula .  No pets, no recent travel . No known occupational exposure.  Worse first thing in am and At bedtime   No cough during sleep.  Worse with laughing , talking and cold air.  No chest pain,orthopnea, edema or  fever.   04/15/13-  42 yoF former smoker followed for cough and shortness of breath FOLLOWS FOR:  Still having cough with thick yellow mucus and clearing throat with wheezing at times Acute bronchitis was "bad" in Feb- saw TP. Treated Prilosec for cough and instructed anti-reflux measures. Doesn't feel reflux so stopped Prilosec with no change.  Breo no help. Sputum pale green. Cough now some better with BID Delsym. No blood/ fever. 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  Electronically Signed  By: Margaree Mackintosh M.D.  On: 03/07/2013 14:50  ROS-see HPI Constitutional:   No-   weight loss, night sweats, fevers, chills, fatigue, lassitude. HEENT:   No-  headaches, difficulty swallowing, tooth/dental problems, sore throat,       No-  sneezing, itching, ear ache, nasal congestion, post nasal drip,  CV:  No-   chest pain, orthopnea, PND, swelling in lower extremities, anasarca, dizziness, palpitations Resp: No-   shortness of breath with exertion or at rest.              + productive cough,  No non-productive cough,  No- coughing up of blood.              No-   change in color of mucus.  No- wheezing.   Skin: No-   rash or lesions. GI:  No-   heartburn, indigestion, abdominal pain, nausea, vomiting,  GU:  MS:  No-   joint pain or swelling.  Neuro-     nothing unusual Psych:  No- change in mood or affect. No depression or anxiety.  No memory loss.  OBJ- Physical Exam General- Alert, Oriented, Affect-appropriate, Distress- none acute Skin- rash-none, lesions- none, excoriation- none Lymphadenopathy- none Head- atraumatic            Eyes- Gross vision intact, PERRLA, conjunctivae and secretions clear            Ears- Hearing, canals-normal            Nose- Clear, no-Septal dev, mucus, polyps, erosion, perforation             Throat- Mallampati II , mucosa clear , drainage- none, tonsils- atrophic.  +frequent throat                     clearing Neck- flexible , trachea midline, no stridor , thyroid nl, carotid no bruit Chest - symmetrical excursion , unlabored           Heart/CV- RRR , no murmur , no gallop  , no rub, nl s1 s2                           - JVD- none , edema- none, stasis changes- none, varices- none           Lung- +faint rhonchi both bases, wheeze- none, cough+bronchitic , dullness-none, rub- none           Chest wall-  Abd-  Br/ Gen/ Rectal- Not done, not indicated Extrem- cyanosis- none, clubbing, none, atrophy- none, strength- nl Neuro- grossly intact to observation

## 2013-04-15 NOTE — Patient Instructions (Addendum)
Order- CT chest with contrast   Dx bronchiectasis  Order- lab- BMET   Bronchiectasis              Lab- Sputum C&S routine, fungal, AFB  Continue Prilosec, Delsym  Ok to try off chlortrimeton, prednisone  Refill script hydromet cough syrup

## 2013-04-18 LAB — RESPIRATORY CULTURE OR RESPIRATORY AND SPUTUM CULTURE

## 2013-04-21 ENCOUNTER — Ambulatory Visit (INDEPENDENT_AMBULATORY_CARE_PROVIDER_SITE_OTHER)
Admission: RE | Admit: 2013-04-21 | Discharge: 2013-04-21 | Disposition: A | Discharge status: Home / Self Care | Payer: Federal, State, Local not specified - PPO | Source: Ambulatory Visit | Attending: Internal Medicine | Admitting: Internal Medicine

## 2013-04-21 DIAGNOSIS — J479 Bronchiectasis, uncomplicated: Secondary | ICD-10-CM

## 2013-04-21 MED ORDER — IOHEXOL 300 MG/ML  SOLN
80.0000 mL | Freq: Once | INTRAMUSCULAR | Status: AC | PRN
  Administered 2013-04-21: 80 mL via INTRAVENOUS

## 2013-04-22 ENCOUNTER — Telehealth: Payer: Self-pay | Admitting: Internal Medicine

## 2013-04-22 NOTE — Telephone Encounter (Signed)
I called her about her CT. There is a persistent density in the LLL which is non-specific for infection or neoplasm. She is agreeable to discussion of bronchoscopy. I will show image to my associates. I asked her to call us Monday if we don't call her.

## 2013-04-22 NOTE — Assessment & Plan Note (Signed)
Chronic bronchitis symptoms began like infection with no obvious aspiration event. Persistent vague CXR density. Plan- sputum, CT chest, continue reflux precautions and take Prilosec for now. Refill Hycodan.  Ok to try off chlortrimeton and stay off prednisone.

## 2013-04-22 NOTE — Telephone Encounter (Signed)
Pt is requesting sputum results. From 04/15/13. Please advise Dr. Annamaria Boots thanks

## 2013-04-25 ENCOUNTER — Other Ambulatory Visit: Payer: Self-pay | Admitting: Internal Medicine

## 2013-04-25 ENCOUNTER — Telehealth: Payer: Self-pay | Admitting: Emergency Medicine

## 2013-04-25 DIAGNOSIS — R911 Solitary pulmonary nodule: Secondary | ICD-10-CM

## 2013-04-25 MED ORDER — AMOXICILLIN-POT CLAVULANATE 875-125 MG PO TABS: 1.0000 | ORAL_TABLET | Freq: Two times a day (BID) | ORAL | Status: DC

## 2013-04-25 NOTE — Telephone Encounter (Signed)
I have discussed CT with Dr Lamonte Sakai and he indicated he agreed bronchoscopy looks appropriate. I have sent him a message asking he contact Mrs Climer to arrange this.

## 2013-04-25 NOTE — Telephone Encounter (Signed)
I called and spoke with pt. She is asking for status of bronch being scheduled. I do not see anything ordered. Please advise CDY thanks

## 2013-04-25 NOTE — Telephone Encounter (Signed)
See earlier note.

## 2013-04-25 NOTE — Telephone Encounter (Signed)
I spoke with [facility] this morning about this and will contact patient-I will send to her as high priority to get this scheduled quickly.

## 2013-04-26 ENCOUNTER — Encounter (HOSPITAL_COMMUNITY): Payer: Self-pay | Admitting: Pharmacy Technician

## 2013-04-26 NOTE — Telephone Encounter (Signed)
enb 04/28/13@2pm  pt aware Rachel Brewer

## 2013-04-26 NOTE — Telephone Encounter (Signed)
Will set up [place], as soon as able depending on OR space. Order placed, will work to schedule with [facility].

## 2013-04-26 NOTE — Pre-Procedure Instructions (Signed)
Rachel Brewer  04/26/2013   Your procedure is scheduled on:  Thurs, Mar 26 @ 2:00 PM  Report to [facility]  at 8:30 AM.  Call this number if you have problems the morning of surgery: [phone]   Remember:   Do not eat food or drink liquids after midnight.   Take these medicines the morning of surgery with A SIP OF WATER: Amoxicillin-Clavulanate(Augmentin),Fexofenadine(Allegra),Fluticasone(Veramyst),Pain Pill(if needed),Metoprolol(Toprol),and Omeprazole(Prilosec)               No Goody's,BC's,Aleve,Aspirin,Ibuprofen,Fish Oil,or any Herbal Medications   Do not wear jewelry, make-up or nail polish.  Do not wear lotions, powders, or perfumes. You may wear deodorant.  Do not shave 48 hours prior to surgery.   Do not bring valuables to the hospital.  [facility] is not responsible                  for any belongings or valuables.               Contacts, dentures or bridgework may not be worn into surgery.  Leave suitcase in the car. After surgery it may be brought to your room.  For patients admitted to the hospital, discharge time is determined by your                treatment team.               Patients discharged the day of surgery will not be allowed to drive  home.    Special Instructions:  Rachel Brewer - Preparing for Surgery  Before surgery, you can play an important role.  Because skin is not sterile, your skin needs to be as free of germs as possible.  You can reduce the number of germs on you skin by washing with CHG (chlorahexidine gluconate) soap before surgery.  CHG is an antiseptic cleaner which kills germs and bonds with the skin to continue killing germs even after washing.  Please DO NOT use if you have an allergy to CHG or antibacterial soaps.  If your skin becomes reddened/irritated stop using the CHG and inform your nurse when you arrive at Short Stay.  Do not shave (including legs and underarms) for at least 48 hours prior to the first CHG shower.   You may shave your face.  Please follow these instructions carefully:   1.  Shower with CHG Soap the night before surgery and the                                morning of Surgery.  2.  If you choose to wash your hair, wash your hair first as usual with your       normal shampoo.  3.  After you shampoo, rinse your hair and body thoroughly to remove the                      Shampoo.  4.  Use CHG as you would any other liquid soap.  You can apply chg directly       to the skin and wash gently with scrungie or a clean washcloth.  5.  Apply the CHG Soap to your body ONLY FROM THE NECK DOWN.        Do not use on open wounds or open sores.  Avoid contact with your eyes,       ears, mouth and genitals (  private parts).  Wash genitals (private parts)       with your normal soap.  6.  Wash thoroughly, paying special attention to the area where your surgery        will be performed.  7.  Thoroughly rinse your body with warm water from the neck down.  8.  DO NOT shower/wash with your normal soap after using and rinsing off       the CHG Soap.  9.  Pat yourself dry with a clean towel.            10.  Wear clean pajamas.            11.  Place clean sheets on your bed the night of your first shower and do not        sleep with pets.  Day of Surgery  Do not apply any lotions/deoderants the morning of surgery.  Please wear clean clothes to the hospital/surgery center.     Please read over the following fact sheets that you were given: Pain Booklet and Coughing and Deep Breathing

## 2013-04-27 ENCOUNTER — Encounter (HOSPITAL_COMMUNITY): Payer: Self-pay

## 2013-04-27 ENCOUNTER — Encounter (HOSPITAL_COMMUNITY)
Admission: RE | Admit: 2013-04-27 | Discharge: 2013-04-27 | Disposition: A | Discharge status: Home / Self Care | Payer: Federal, State, Local not specified - PPO | Source: Ambulatory Visit | Attending: Emergency Medicine | Admitting: Emergency Medicine

## 2013-04-27 HISTORY — DX: Pneumonia, unspecified organism: J18.9

## 2013-04-27 HISTORY — DX: Gastro-esophageal reflux disease without esophagitis: K21.9

## 2013-04-27 HISTORY — DX: Headache: R51

## 2013-04-27 LAB — BASIC METABOLIC PANEL
BUN: 15 mg/dL (ref 6–23)
CALCIUM: 9.4 mg/dL (ref 8.4–10.5)
CO2: 23 meq/L (ref 19–32)
CREATININE: 0.82 mg/dL (ref 0.50–1.10)
Chloride: 100 mEq/L (ref 96–112)
GFR calc Af Amer: 86 mL/min — ABNORMAL LOW (ref >90)
GFR calc non Af Amer: 74 mL/min — ABNORMAL LOW (ref >90)
Glucose, Bld: 97 mg/dL (ref 70–99)
Potassium: 3.8 mEq/L (ref 3.7–5.3)
Sodium: 140 mEq/L (ref 137–147)

## 2013-04-27 LAB — CBC
HCT: 43.9 % (ref 36.0–46.0)
Hemoglobin: 15.1 g/dL — ABNORMAL HIGH (ref 12.0–15.0)
MCH: 28.9 pg (ref 26.0–34.0)
MCHC: 34.4 g/dL (ref 30.0–36.0)
MCV: 83.9 fL (ref 78.0–100.0)
PLATELETS: 298 10*3/uL (ref 150–400)
RBC: 5.23 MIL/uL — ABNORMAL HIGH (ref 3.87–5.11)
RDW: 13.6 % (ref 11.5–15.5)
WBC: 8.7 10*3/uL (ref 4.0–10.5)

## 2013-04-28 ENCOUNTER — Encounter (HOSPITAL_COMMUNITY)
Admission: RE | Disposition: A | Discharge status: Home / Self Care | Payer: Self-pay | Source: Ambulatory Visit | Attending: Emergency Medicine

## 2013-04-28 ENCOUNTER — Ambulatory Visit (HOSPITAL_COMMUNITY): Payer: Federal, State, Local not specified - PPO

## 2013-04-28 ENCOUNTER — Ambulatory Visit (HOSPITAL_COMMUNITY)
Admission: RE | Admit: 2013-04-28 | Discharge: 2013-04-28 | Disposition: A | Discharge status: Home / Self Care | Payer: Federal, State, Local not specified - PPO | Source: Ambulatory Visit | Attending: Emergency Medicine | Admitting: Emergency Medicine

## 2013-04-28 ENCOUNTER — Other Ambulatory Visit: Payer: Self-pay | Admitting: Emergency Medicine

## 2013-04-28 ENCOUNTER — Ambulatory Visit (HOSPITAL_COMMUNITY): Payer: Federal, State, Local not specified - PPO | Admitting: Certified Registered Nurse Anesthetist

## 2013-04-28 ENCOUNTER — Encounter (HOSPITAL_COMMUNITY): Payer: Self-pay | Admitting: *Deleted

## 2013-04-28 ENCOUNTER — Encounter (HOSPITAL_COMMUNITY): Payer: Federal, State, Local not specified - PPO | Admitting: Certified Registered Nurse Anesthetist

## 2013-04-28 DIAGNOSIS — R911 Solitary pulmonary nodule: Secondary | ICD-10-CM | POA: Diagnosis present

## 2013-04-28 DIAGNOSIS — Z87891 Personal history of nicotine dependence: Secondary | ICD-10-CM | POA: Insufficient documentation

## 2013-04-28 DIAGNOSIS — K219 Gastro-esophageal reflux disease without esophagitis: Secondary | ICD-10-CM | POA: Insufficient documentation

## 2013-04-28 DIAGNOSIS — R51 Headache: Secondary | ICD-10-CM | POA: Insufficient documentation

## 2013-04-28 DIAGNOSIS — Z01812 Encounter for preprocedural laboratory examination: Secondary | ICD-10-CM | POA: Insufficient documentation

## 2013-04-28 DIAGNOSIS — I10 Essential (primary) hypertension: Secondary | ICD-10-CM | POA: Insufficient documentation

## 2013-04-28 DIAGNOSIS — Z01810 Encounter for preprocedural cardiovascular examination: Secondary | ICD-10-CM | POA: Insufficient documentation

## 2013-04-28 HISTORY — PX: VIDEO BRONCHOSCOPY WITH ENDOBRONCHIAL NAVIGATION: SHX6175

## 2013-04-28 SURGERY — VIDEO BRONCHOSCOPY WITH ENDOBRONCHIAL NAVIGATION
Anesthesia: General

## 2013-04-28 MED ORDER — NEOSTIGMINE METHYLSULFATE 1 MG/ML IJ SOLN
INTRAMUSCULAR | Status: DC | PRN
  Administered 2013-04-28: 4 mg via INTRAVENOUS

## 2013-04-28 MED ORDER — ROCURONIUM BROMIDE 100 MG/10ML IV SOLN
INTRAVENOUS | Status: DC | PRN
  Administered 2013-04-28: 20 mg via INTRAVENOUS

## 2013-04-28 MED ORDER — HYDROMORPHONE HCL PF 1 MG/ML IJ SOLN: 0.2500 mg | INTRAMUSCULAR | Status: DC | PRN

## 2013-04-28 MED ORDER — PROPOFOL 10 MG/ML IV BOLUS
INTRAVENOUS | Status: AC
  Filled 2013-04-28: qty 20

## 2013-04-28 MED ORDER — PROPOFOL INFUSION 10 MG/ML OPTIME
INTRAVENOUS | Status: DC | PRN
  Administered 2013-04-28: 100 ug/kg/min via INTRAVENOUS

## 2013-04-28 MED ORDER — GLYCOPYRROLATE 0.2 MG/ML IJ SOLN
INTRAMUSCULAR | Status: AC
  Filled 2013-04-28: qty 4

## 2013-04-28 MED ORDER — SUCCINYLCHOLINE CHLORIDE 20 MG/ML IJ SOLN
INTRAMUSCULAR | Status: AC
  Filled 2013-04-28: qty 1

## 2013-04-28 MED ORDER — ONDANSETRON HCL 4 MG/2ML IJ SOLN
INTRAMUSCULAR | Status: AC
  Filled 2013-04-28: qty 2

## 2013-04-28 MED ORDER — LACTATED RINGERS IV SOLN
INTRAVENOUS | Status: DC
  Administered 2013-04-28: 14:00:00 via INTRAVENOUS

## 2013-04-28 MED ORDER — ONDANSETRON HCL 4 MG/2ML IJ SOLN
INTRAMUSCULAR | Status: DC | PRN
  Administered 2013-04-28: 4 mg via INTRAVENOUS

## 2013-04-28 MED ORDER — GLYCOPYRROLATE 0.2 MG/ML IJ SOLN
INTRAMUSCULAR | Status: DC | PRN
  Administered 2013-04-28: 0.6 mg via INTRAVENOUS

## 2013-04-28 MED ORDER — FENTANYL CITRATE 0.05 MG/ML IJ SOLN
INTRAMUSCULAR | Status: DC | PRN
  Administered 2013-04-28 (×2): 50 ug via INTRAVENOUS
  Administered 2013-04-28: 100 ug via INTRAVENOUS
  Administered 2013-04-28: 50 ug via INTRAVENOUS

## 2013-04-28 MED ORDER — LIDOCAINE HCL (CARDIAC) 20 MG/ML IV SOLN
INTRAVENOUS | Status: AC
  Filled 2013-04-28: qty 10

## 2013-04-28 MED ORDER — NEOSTIGMINE METHYLSULFATE 1 MG/ML IJ SOLN
INTRAMUSCULAR | Status: AC
  Filled 2013-04-28: qty 10

## 2013-04-28 MED ORDER — MIDAZOLAM HCL 2 MG/2ML IJ SOLN
INTRAMUSCULAR | Status: AC
  Filled 2013-04-28: qty 2

## 2013-04-28 MED ORDER — ARTIFICIAL TEARS OP OINT
TOPICAL_OINTMENT | OPHTHALMIC | Status: DC | PRN
  Administered 2013-04-28: 1 via OPHTHALMIC

## 2013-04-28 MED ORDER — LIDOCAINE HCL (CARDIAC) 20 MG/ML IV SOLN
INTRAVENOUS | Status: DC | PRN
  Administered 2013-04-28: 80 mg via INTRAVENOUS

## 2013-04-28 MED ORDER — ROCURONIUM BROMIDE 50 MG/5ML IV SOLN
INTRAVENOUS | Status: AC
  Filled 2013-04-28: qty 1

## 2013-04-28 MED ORDER — SUCCINYLCHOLINE CHLORIDE 20 MG/ML IJ SOLN
INTRAMUSCULAR | Status: DC | PRN
  Administered 2013-04-28: 100 mg via INTRAVENOUS

## 2013-04-28 MED ORDER — PROPOFOL 10 MG/ML IV BOLUS
INTRAVENOUS | Status: DC | PRN
  Administered 2013-04-28: 20 mg via INTRAVENOUS
  Administered 2013-04-28: 200 mg via INTRAVENOUS
  Administered 2013-04-28: 50 mg via INTRAVENOUS

## 2013-04-28 MED ORDER — FENTANYL CITRATE 0.05 MG/ML IJ SOLN
INTRAMUSCULAR | Status: AC
  Filled 2013-04-28: qty 5

## 2013-04-28 MED ORDER — LACTATED RINGERS IV SOLN
INTRAVENOUS | Status: DC | PRN
  Administered 2013-04-28 (×2): via INTRAVENOUS

## 2013-04-28 MED ORDER — LIDOCAINE HCL 4 % MT SOLN
OROMUCOSAL | Status: DC | PRN
  Administered 2013-04-28: 2 mL via TOPICAL

## 2013-04-28 MED ORDER — EPINEPHRINE HCL 1 MG/ML IJ SOLN
INTRAMUSCULAR | Status: AC
  Filled 2013-04-28: qty 1

## 2013-04-28 SURGICAL SUPPLY — 36 items
BRUSH CYTOL CELLEBRITY 1.5X140 (MISCELLANEOUS) IMPLANT
BRUSH SUPERTRAX BIOPSY (INSTRUMENTS) ×3 IMPLANT
BRUSH SUPERTRAX NDL-TIP CYTO (INSTRUMENTS) ×3 IMPLANT
CANISTER SUCTION 2500CC (MISCELLANEOUS) ×3 IMPLANT
CHANNEL WORK EXTEND EDGE 180 (KITS) IMPLANT
CHANNEL WORK EXTEND EDGE 45 (KITS) IMPLANT
CHANNEL WORK EXTEND EDGE 90 (KITS) IMPLANT
CONT SPEC 4OZ CLIKSEAL STRL BL (MISCELLANEOUS) ×3 IMPLANT
COVER TABLE BACK 60X90 (DRAPES) ×3 IMPLANT
FILTER STRAW FLUID ASPIR (MISCELLANEOUS) IMPLANT
FORCEPS BIOP SUPERTRX PREMAR (INSTRUMENTS) IMPLANT
GLOVE BIO SURGEON STRL SZ 6.5 (GLOVE) ×2 IMPLANT
GLOVE BIO SURGEONS STRL SZ 6.5 (GLOVE) ×1
GLOVE BIOGEL M STRL SZ7.5 (GLOVE) ×6 IMPLANT
GLOVE BIOGEL PI IND STRL 6.5 (GLOVE) ×1 IMPLANT
GLOVE BIOGEL PI INDICATOR 6.5 (GLOVE) ×2
KIT LOCATABLE GUIDE (CANNULA) IMPLANT
KIT MARKER FIDUCIAL DELIVERY (KITS) IMPLANT
KIT PROCEDURE EDGE 180 (KITS) IMPLANT
KIT PROCEDURE EDGE 45 (KITS) IMPLANT
KIT PROCEDURE EDGE 90 (KITS) ×3 IMPLANT
KIT ROOM TURNOVER OR (KITS) ×3 IMPLANT
MARKER SKIN DUAL TIP RULER LAB (MISCELLANEOUS) ×3 IMPLANT
NEEDLE SUPERTRX PREMARK BIOPSY (NEEDLE) ×3 IMPLANT
NS IRRIG 1000ML POUR BTL (IV SOLUTION) ×3 IMPLANT
OIL SILICONE PENTAX (PARTS (SERVICE/REPAIRS)) ×3 IMPLANT
PAD ARMBOARD 7.5X6 YLW CONV (MISCELLANEOUS) ×6 IMPLANT
PATCHES PATIENT (LABEL) ×3 IMPLANT
SPONGE GAUZE 4X4 12PLY (GAUZE/BANDAGES/DRESSINGS) ×3 IMPLANT
SYR 20CC LL (SYRINGE) ×3 IMPLANT
SYR 20ML ECCENTRIC (SYRINGE) ×3 IMPLANT
SYR 50ML SLIP (SYRINGE) IMPLANT
TOWEL OR 17X24 6PK STRL BLUE (TOWEL DISPOSABLE) ×3 IMPLANT
TRAP SPECIMEN MUCOUS 40CC (MISCELLANEOUS) ×3 IMPLANT
TUBE CONNECTING 12'X1/4 (SUCTIONS) ×1
TUBE CONNECTING 12X1/4 (SUCTIONS) ×2 IMPLANT

## 2013-04-28 NOTE — Anesthesia Preprocedure Evaluation (Signed)
Anesthesia Evaluation  Patient identified by MRN, date of birth, ID band Patient awake    Reviewed: Allergy & Precautions, H&P , NPO status , Patient's Chart, lab work & pertinent test results, reviewed documented beta blocker date and time   Airway Mallampati: III TM Distance: >3 FB Neck ROM: Full    Dental no notable dental hx. (+) Teeth Intact, Dental Advisory Given   Pulmonary pneumonia -, resolved, former smoker,  breath sounds clear to auscultation  Pulmonary exam normal       Cardiovascular hypertension, On Medications and On Home Beta Blockers Rhythm:Regular Rate:Normal     Neuro/Psych  Headaches, negative psych ROS   GI/Hepatic Neg liver ROS, GERD-  Medicated and Controlled,  Endo/Other  negative endocrine ROSMorbid obesity  Renal/GU negative Renal ROS  negative genitourinary   Musculoskeletal   Abdominal   Peds  Hematology negative hematology ROS (+)   Anesthesia Other Findings   Reproductive/Obstetrics negative OB ROS                           Anesthesia Physical Anesthesia Plan  ASA: III  Anesthesia Plan: General   Post-op Pain Management:    Induction: Intravenous  Airway Management Planned: Oral ETT  Additional Equipment:   Intra-op Plan:   Post-operative Plan: Extubation in OR  Informed Consent: I have reviewed the patients History and Physical, chart, labs and discussed the procedure including the risks, benefits and alternatives for the proposed anesthesia with the patient or authorized representative who has indicated his/her understanding and acceptance.   Dental advisory given  Plan Discussed with: CRNA  Anesthesia Plan Comments:         Anesthesia Quick Evaluation

## 2013-04-28 NOTE — Preoperative (Signed)
Beta Blockers   Reason not to administer Beta Blockers:Not Applicable 

## 2013-04-28 NOTE — Progress Notes (Signed)
Maryann RN at bedside and receiving report from [employer]

## 2013-04-28 NOTE — H&P (View-Only) (Signed)
12/06/12- 60 yoF former smoker referred by Dr Dorthy Cooler for cough and shortness of breath Had smoked one pack per day for 13 years, ending in 1986. Complains of cough or shortness of breath starting in August of 2014. Had come back from vacation. Husband had sore throat that time but resolved. She got a sore throat than rapid progression with fever and chills, malaise and weakness. She was treated by her primary physician service with doxycycline and then with a Z-Pak and prednisone. She improved but cough remained productive. She then saw Dr. Inda Merlin September 18 with increased prednisone taper. Chest x-ray report from 10/21/2012 describes interval development of airspace and linear density in the left base consistent with subsegmental atelectasis or pneumonia. She was given a third Z-Pak. Cough remains productive but clear. October 24 she started a fourth Z-Pak plus Augmentin For 7 days resulting in GI upset which has slowly improved. He doxycycline earlier in the course had upset her stomach. Followup chest x-ray 11/26/2012: Persistent mild hazy left lung base opacity. "Given the lack of change this is most likely atelectasis".she still has some cough but no longer short of breath, chest pain fever. There has been no blood. Has had flu vaccine. She denies reflux or any history of DVT/PE. 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   03/18/2013 Acute OV  Pt of Dr. Annamaria Boots  .  Complains of  prod cough with light green/beige mucus, sore throat from cough, rattling in chest at night since Aug 2014.  Began Breo on 2/5 with minimal improvement beginning within the last 2 days.  Denies increased SOB, tightness, hemoptysis, nausea, vomiting, edema.  CXR 03/07/13  discoid atx in lingula .  No pets, no recent travel . No known occupational exposure.  Worse first thing in am and At bedtime   No cough during sleep.  Worse with laughing , talking and cold air.  No chest pain,orthopnea, edema or  fever.   04/15/13-  42 yoF former smoker followed for cough and shortness of breath FOLLOWS FOR:  Still having cough with thick yellow mucus and clearing throat with wheezing at times Acute bronchitis was "bad" in Feb- saw TP. Treated Prilosec for cough and instructed anti-reflux measures. Doesn't feel reflux so stopped Prilosec with no change.  Breo no help. Sputum pale green. Cough now some better with BID Delsym. No blood/ fever. 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  Electronically Signed  By: Margaree Mackintosh M.D.  On: 03/07/2013 14:50  ROS-see HPI Constitutional:   No-   weight loss, night sweats, fevers, chills, fatigue, lassitude. HEENT:   No-  headaches, difficulty swallowing, tooth/dental problems, sore throat,       No-  sneezing, itching, ear ache, nasal congestion, post nasal drip,  CV:  No-   chest pain, orthopnea, PND, swelling in lower extremities, anasarca, dizziness, palpitations Resp: No-   shortness of breath with exertion or at rest.              + productive cough,  No non-productive cough,  No- coughing up of blood.              No-   change in color of mucus.  No- wheezing.   Skin: No-   rash or lesions. GI:  No-   heartburn, indigestion, abdominal pain, nausea, vomiting,  GU:  MS:  No-   joint pain or swelling.  Neuro-     nothing unusual Psych:  No- change in mood or affect. No depression or anxiety.  No memory loss.  OBJ- Physical Exam General- Alert, Oriented, Affect-appropriate, Distress- none acute Skin- rash-none, lesions- none, excoriation- none Lymphadenopathy- none Head- atraumatic            Eyes- Gross vision intact, PERRLA, conjunctivae and secretions clear            Ears- Hearing, canals-normal            Nose- Clear, no-Septal dev, mucus, polyps, erosion, perforation             Throat- Mallampati II , mucosa clear , drainage- none, tonsils- atrophic.  +frequent throat                     clearing Neck- flexible , trachea midline, no stridor , thyroid nl, carotid no bruit Chest - symmetrical excursion , unlabored           Heart/CV- RRR , no murmur , no gallop  , no rub, nl s1 s2                           - JVD- none , edema- none, stasis changes- none, varices- none           Lung- +faint rhonchi both bases, wheeze- none, cough+bronchitic , dullness-none, rub- none           Chest wall-  Abd-  Br/ Gen/ Rectal- Not done, not indicated Extrem- cyanosis- none, clubbing, none, atrophy- none, strength- nl Neuro- grossly intact to observation

## 2013-04-28 NOTE — Progress Notes (Signed)
Care of pt assumed by MA [place] Carbo RN from Darron Doom RN

## 2013-04-28 NOTE — Anesthesia Postprocedure Evaluation (Signed)
  Anesthesia Post-op Note  Patient: Rachel Brewer  Procedure(s) Performed: Procedure(s): VIDEO BRONCHOSCOPY WITH ENDOBRONCHIAL NAVIGATION (N/A)  Patient Location: PACU  Anesthesia Type:General  Level of Consciousness: awake and alert   Airway and Oxygen Therapy: Patient Spontanous Breathing  Post-op Pain: mild  Post-op Assessment: Post-op Vital signs reviewed  Post-op Vital Signs: stable  Complications: No apparent anesthesia complications

## 2013-04-28 NOTE — Interval H&P Note (Signed)
PCCM Interval note:   Very pleasant 63 yo woman following w Dr Annamaria Boots to work up cough in aftermath of a recent CAP. W/u has revealed LLL opacity > atx vs irregular nodule. She presents now for ENB and biopsies. No new issues. She continues to cough. She is currently on augmentin for pneumococcus in sputum.   Filed Vitals:   04/28/13 1218 04/28/13 1300  BP: 147/88   Pulse: 84   Temp: 98.4 F (36.9 C)   TempSrc: Oral   Weight: 140.275 kg (309 lb 4 oz) 140.275 kg (309 lb 4 oz)  SpO2: 96%     Recent Labs Lab 04/27/13 1009  HGB 15.1*  HCT 43.9  WBC 8.7  PLT 298    Recent Labs Lab 04/27/13 1009  NA 140  K 3.8  CL 100  CO2 23  GLUCOSE 97  BUN 15  CREATININE 0.82  CALCIUM 9.4     CT scan 3/23 /15 --  FINDINGS:  The heart size appears normal. No pericardial effusion. Borderline  enlarged right paratracheal lymph node contains a fatty hilum  measuring 1.1 cm. No sub- carinal or hilar adenopathy. The esophagus  appears normal. There is no axillary or supraclavicular adenopathy.  A 3 cm nodule is identified within the left lobe of thyroid gland.  No pleural effusion identified. Persistent subsegmental area of  atelectasis and consolidation within the posterior left lower lobe,  image 122/series 603. This is not significantly changed when  compared with chest radiograph from 10/21/2012.  Incidental imaging through the upper abdomen is significant for  hepatic steatosis. The patient is status post gastric banding.  Review of the visualized bony structures is significant for mild  thoracic spondylosis. No aggressive lytic or sclerotic bone lesions  identified.  IMPRESSION:  1. Persistent subsegmental consolidation and atelectasis in the  posterior left lower lobe. Although no mass is identified,  bronchoscopy may be helpful to evaluate for underlying endobronchial  lesion.  2. 3 cm nodule is identified within the left lobe of thyroid gland.   Plan:  ENB today with  biopsies and cx's  Baltazar Apo, MD, PhD 04/28/2013, 2:08 PM [facility] [phone] or if no answer [phone]

## 2013-04-28 NOTE — Discharge Instructions (Signed)
Flexible Bronchoscopy, Care After These instructions give you information on caring for yourself after your procedure. Your doctor may also give you more specific instructions. Call your doctor if you have any problems or questions after your procedure. HOME CARE  Do not eat or drink anything for 2 hours after your procedure. If you try to eat or drink before the medicine wears off, food or drink could go into your lungs. You could also burn yourself.  After 2 hours have passed and when you can cough and gag normally, you may eat soft food and drink liquids slowly.  The day after the test, you may eat your normal diet.  You may do your normal activities.  Keep all doctor visits. GET HELP RIGHT AWAY IF:  You get more and more short of breath.  You get lightheaded.  You feel like you are going to pass out (faint).  You have chest pain.  You have new problems that worry you.  You cough up more than a little blood.  You cough up more blood than before. MAKE SURE YOU:  Understand these instructions.  Will watch your condition.  Will get help right away if you are not doing well or get worse. Document Released: 11/17/2008 Document Revised: 11/10/2012 Document Reviewed: 09/24/2012 [facility] Patient Information 2014 [place].  Please call our office for any questions or problems. [phone].  What to eat:  For your first meals, you should eat lightly; only small meals initially.  If you do not have nausea, you may eat larger meals.  Avoid spicy, greasy and heavy food.    General Anesthesia, Adult, Care After  Refer to this sheet in the next few weeks. These instructions provide you with information on caring for yourself after your procedure. Your health care provider may also give you more specific instructions. Your treatment has been planned according to current medical practices, but problems sometimes occur. Call your health care provider if you have any problems or  questions after your procedure.  WHAT TO EXPECT AFTER THE PROCEDURE  After the procedure, it is typical to experience:  Sleepiness.  Nausea and vomiting. HOME CARE INSTRUCTIONS  For the first 24 hours after general anesthesia:  Have a responsible person with you.  Do not drive a car. If you are alone, do not take public transportation.  Do not drink alcohol.  Do not take medicine that has not been prescribed by your health care provider.  Do not sign important papers or make important decisions.  You may resume a normal diet and activities as directed by your health care provider.  Change bandages (dressings) as directed.  If you have questions or problems that seem related to general anesthesia, call the hospital and ask for the anesthetist or anesthesiologist on call. SEEK MEDICAL CARE IF:  You have nausea and vomiting that continue the day after anesthesia.  You develop a rash. SEEK IMMEDIATE MEDICAL CARE IF:  You have difficulty breathing.  You have chest pain.  You have any allergic problems. Document Released: 04/28/2000 Document Revised: 09/22/2012 Document Reviewed: 08/05/2012  [facility] Patient Information 2014 [place], [place].

## 2013-04-28 NOTE — Op Note (Signed)
Video Bronchoscopy with Electromagnetic Navigation Procedure Note  Date of Operation: 04/28/2013  Pre-op Diagnosis: LLL nodule  Post-op Diagnosis: same  Surgeon: Baltazar Apo  Assistants: Carman Ching  Anesthesia: General endotracheal anesthesia  Operation: Flexible video fiberoptic bronchoscopy with electromagnetic navigation and biopsies.  Estimated Blood Loss: Minimal  Complications: NONE APPARENT  Indications and History: Rachel Brewer is a 63 y.o. female with cough. A CXR and then CT scan showed LLL irregular opacity. Recommendation was made to pursue tissue sampling with navigational bronchoscopy.  The risks, benefits, complications, treatment options and expected outcomes were discussed with the patient.  The possibilities of pneumothorax, pneumonia, reaction to medication, pulmonary aspiration, perforation of a viscus, bleeding, failure to diagnose a condition and creating a complication requiring transfusion or operation were discussed with the patient who freely signed the consent.    Description of Procedure: The patient was seen in the Preoperative Area, was examined and was deemed appropriate to proceed.  The patient was taken to [facility] OR 10, identified as Irean Hong [facility]oux and the procedure verified as Flexible Video Fiberoptic Bronchoscopy.  A Time Out was held and the above information confirmed.   Prior to the date of the procedure a high-resolution CT scan of the chest was performed. Utilizing [place] a virtual tracheobronchial tree was generated to allow the creation of distinct navigation pathways to the patient's LLL abnormality. General anesthesia was initiated and the patient  was orally intubated. The video fiberoptic bronchoscope was introduced via the endotracheal tube and a general inspection was performed which showed normal airways throughout. There were no endobronchial lesions or abnormal secretions. The extendable working channel and  locator guide were introduced into the bronchoscope. The distinct navigation pathways prepared prior to this procedure were then utilized to navigate to within 1.8cm of patient's lesion identified on CT scan. Although there were adjacent airways that led to the lesion in question, navigation was difficult.  The extendable working channel was secured into place and the locator guide was withdrawn. Under fluoroscopic guidance transbronchial needle brushings, transbronchial Wang needle biopsies, and transbronchial forceps biopsies were performed to be sent for cytology and pathology. A bronchioalveolar lavage was performed in the LLL and sent for cytology and microbiology (bacterial, fungal, AFB smears and cultures). At the end of the procedure a general airway inspection was performed and there was no evidence of active bleeding. The bronchoscope was removed.  The patient tolerated the procedure well. There was no significant blood loss and there were no obvious complications. A post-procedural chest x-ray is pending.  Samples: 1. Transbronchial needle brushings from LLL 2. Transbronchial Wang needle biopsies from LLL 3. Transbronchial forceps biopsies from LLL 4. Bronchoalveolar lavage from LLL  Plans:  The patient will be discharged from the PACU to home when recovered from anesthesia and after chest x-ray is reviewed. We will review the cytology, pathology and microbiology results with the patient when they become available. Outpatient followup will be with Dr Lamonte Sakai and Dr Annamaria Boots.   Baltazar Apo, MD, PhD 04/28/2013, 3:45 PM [facility] [phone] or if no answer [phone]

## 2013-04-28 NOTE — Transfer of Care (Signed)
Immediate Anesthesia Transfer of Care Note  Patient: Rachel Brewer  Procedure(s) Performed: Procedure(s): VIDEO BRONCHOSCOPY WITH ENDOBRONCHIAL NAVIGATION (N/A)  Patient Location: PACU  Anesthesia Type:General  Level of Consciousness: oriented, sedated, patient cooperative and responds to stimulation  Airway & Oxygen Therapy: Patient Spontanous Breathing and Patient connected to face mask oxygen  Post-op Assessment: Report given to PACU RN, Post -op Vital signs reviewed and stable, Patient moving all extremities and Patient moving all extremities X 4  Post vital signs: Reviewed and stable  Complications: No apparent anesthesia complications

## 2013-04-28 NOTE — Addendum Note (Signed)
Addendum created 04/28/13 1807 by Fidela Juneau, CRNA   Modules edited: Anesthesia Flowsheet

## 2013-04-29 ENCOUNTER — Encounter (HOSPITAL_COMMUNITY): Payer: Self-pay | Admitting: Emergency Medicine

## 2013-04-29 ENCOUNTER — Telehealth: Payer: Self-pay | Admitting: Internal Medicine

## 2013-04-29 NOTE — Telephone Encounter (Signed)
Spoke with patient-she is aware that we do not have any later appts that day; she decided after speaking with her husband to keep Monday at 2pm to get results,etc. Pt is aware that we have no problem if she would like to call her husband once CY is in the room so he can hear the results and ask questions/express concerns as well.

## 2013-05-01 LAB — CULTURE, RESPIRATORY W GRAM STAIN: Culture: NO GROWTH

## 2013-05-02 ENCOUNTER — Ambulatory Visit (INDEPENDENT_AMBULATORY_CARE_PROVIDER_SITE_OTHER): Payer: Federal, State, Local not specified - PPO | Admitting: Internal Medicine

## 2013-05-02 ENCOUNTER — Encounter: Payer: Self-pay | Admitting: Internal Medicine

## 2013-05-02 VITALS — BP 110/66 | HR 91 | Ht 68.25 in

## 2013-05-02 DIAGNOSIS — R911 Solitary pulmonary nodule: Secondary | ICD-10-CM

## 2013-05-02 DIAGNOSIS — R05 Cough: Secondary | ICD-10-CM

## 2013-05-02 DIAGNOSIS — J209 Acute bronchitis, unspecified: Secondary | ICD-10-CM

## 2013-05-02 DIAGNOSIS — R053 Chronic cough: Secondary | ICD-10-CM

## 2013-05-02 DIAGNOSIS — R059 Cough, unspecified: Secondary | ICD-10-CM

## 2013-05-02 MED ORDER — CLARITHROMYCIN 500 MG PO TABS: ORAL_TABLET | ORAL | Status: DC

## 2013-05-02 NOTE — Progress Notes (Signed)
12/06/12- 55 yoF former smoker referred by Dr Dorthy Cooler for cough and shortness of breath Had smoked one pack per day for 13 years, ending in 1986. Complains of cough or shortness of breath starting in August of 2014. Had come back from vacation. Husband had sore throat that time but resolved. She got a sore throat than rapid progression with fever and chills, malaise and weakness. She was treated by her primary physician service with doxycycline and then with a Z-Pak and prednisone. She improved but cough remained productive. She then saw Dr. Inda Merlin September 18 with increased prednisone taper. Chest x-ray report from 10/21/2012 describes interval development of airspace and linear density in the left base consistent with subsegmental atelectasis or pneumonia. She was given a third Z-Pak. Cough remains productive but clear. October 24 she started a fourth Z-Pak plus Augmentin For 7 days resulting in GI upset which has slowly improved. He doxycycline earlier in the course had upset her stomach. Followup chest x-ray 11/26/2012: Persistent mild hazy left lung base opacity. "Given the lack of change this is most likely atelectasis".she still has some cough but no longer short of breath, chest pain fever. There has been no blood. Has had flu vaccine. She denies reflux or any history of DVT/PE. 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   03/18/2013 Acute OV  Pt of Dr. Annamaria Boots  .  Complains of  prod cough with light green/beige mucus, sore throat from cough, rattling in chest at night since Aug 2014.  Began Breo on 2/5 with minimal improvement beginning within the last 2 days.  Denies increased SOB, tightness, hemoptysis, nausea, vomiting, edema.  CXR 03/07/13  discoid atx in lingula .  No pets, no recent travel . No known occupational exposure.  Worse first thing in am and At bedtime   No cough during sleep.  Worse with laughing , talking and cold air.  No chest pain,orthopnea, edema or  fever.   04/15/13-  73 yoF former smoker followed for cough and shortness of breath FOLLOWS FOR:  Still having cough with thick yellow mucus and clearing throat with wheezing at times Acute bronchitis was "bad" in Feb- saw TP. Treated Prilosec for cough and instructed anti-reflux measures. Doesn't feel reflux so stopped Prilosec with no change.  Breo no help. Sputum pale green. Cough now some better with BID Delsym. No blood/ fever. 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  Electronically Signed  By: Margaree Mackintosh M.D.  On: 03/07/2013 14:50  05/02/13- 13 yoF former smoker followed for cough and shortness of breath FOLLOWS FOR: review EBUS results with patient      Husband here CT indicated persistent LLL density. Dr Lamonte Sakai did EBUS for [place]. Culture grew pneumococcus> augmentin. Bronchoscopic lung biopsy 04/28/2013-nonspecific inflammation, no malignancy. Bronchoscopic cultures negative after Augmentin. She says a Z-Pak has been most effective for her respiratory infections. CT chest 04/25/13 IMPRESSION:  1. Persistent subsegmental consolidation and atelectasis in the  posterior left lower lobe. Although no mass is identified,  bronchoscopy may be helpful to evaluate for underlying endobronchial  lesion.  2. 3 cm nodule is identified within the left lobe of thyroid gland.  Electronically Signed  By: Kerby Moors M.D.  On: 04/21/2013 10:03 CXR 04/28/13 IMPRESSION:  1. No active cardiopulmonary disease.  2. Unchanged linear opacity in the left lower lobe. Reference chest  CT 04/21/2013.  Electronically Signed  By: Gilford Silvius.D.  On: 04/28/2013 12:51   ROS-see HPI Constitutional:   No-   weight loss, night sweats, fevers, chills, fatigue, lassitude. HEENT:   No-  headaches, difficulty swallowing, tooth/dental problems, sore throat,       No-  sneezing, itching, ear ache, nasal congestion, post  nasal drip,  CV:  No-   chest pain, orthopnea, PND, swelling in lower extremities, anasarca, dizziness, palpitations Resp: No-   shortness of breath with exertion or at rest.              + productive cough,  + non-productive cough,  No- coughing up of blood.              No-   change in color of mucus.  No- wheezing.   Skin: No-   rash or lesions. GI:  No-   heartburn, indigestion, abdominal pain, nausea, vomiting,  GU:  MS:  No-   joint pain or swelling.   Neuro-     nothing unusual Psych:  No- change in mood or affect. No depression or anxiety.  No memory loss.  OBJ- Physical Exam General- Alert, Oriented, Affect-appropriate, Distress- none acute Skin- rash-none, lesions- none, excoriation- none Lymphadenopathy- none Head- atraumatic            Eyes- Gross vision intact, PERRLA, conjunctivae and secretions clear            Ears- Hearing, canals-normal            Nose- Clear, no-Septal dev, mucus, polyps, erosion, perforation             Throat- Mallampati II , mucosa clear , drainage- none, tonsils- atrophic. +frequent throat                     clearing Neck- flexible , trachea midline, no stridor , thyroid nl, carotid no bruit Chest - symmetrical excursion , unlabored           Heart/CV- RRR , no murmur , no gallop  , no rub, nl s1 s2                           - JVD- none , edema- none, stasis changes- none, varices- none           Lung- +few rhonchi left base, wheeze- none, cough+with deep breath , dullness-none, rub- none           Chest wall-  Abd-  Br/ Gen/ Rectal- Not done, not indicated Extrem- cyanosis- none, clubbing, none, atrophy- none, strength- nl Neuro- grossly intact to observation

## 2013-05-02 NOTE — Patient Instructions (Addendum)
Plan- order future schedule CT chest non-contrast  To be done in July, 2015    Dx lung nodule  Script sent for Biaxin  Ok to take a probiotic like Clinical cytogeneticist, or Activia yogurt

## 2013-05-11 ENCOUNTER — Telehealth: Payer: Self-pay | Admitting: Internal Medicine

## 2013-05-11 NOTE — Telephone Encounter (Signed)
I can see her for f/u as discussed Rachel Brewer.

## 2013-05-11 NOTE — Telephone Encounter (Signed)
Spoke with patient-she is aware of recomendations from [place] and states she is still coughing up phlegm-currently on Biaxin (9th day today). Pt states she just tired but doesn't feel bad. Pt is wondering what to do next and if she and CY should meet to regroup. Pt aware that CY is off for the afternoon and will address her message tomorrow. Pt would like for [place] to call her at number given but if [place] then to call her mobile number on file as she will be traveling back home tomorrow mid day.

## 2013-05-11 NOTE — Telephone Encounter (Signed)
Only normal airway organisms on cultures so far. She had started antibiotic before the bronchoscopy. I don't know why she feels so tired. If naps don't help, then suggest she see [place] or her PCP.

## 2013-05-11 NOTE — Telephone Encounter (Signed)
Spoke with the pt  She is c/o fatigue x 3 days  She states that she feels exhausted and could sleep all day  She reports her cough is unchanged, and denies any SOB, aches, f/c/s She is concerned and wants to know what CDY thinks  She is also requesting the results of her cultures from [facility]  Please advise thanks!

## 2013-05-12 NOTE — Telephone Encounter (Signed)
Tuesday 05-24-13 at 11:15am slot; pt aware of appt date and time.

## 2013-05-13 ENCOUNTER — Telehealth: Payer: Self-pay | Admitting: Internal Medicine

## 2013-05-13 LAB — FUNGUS CULTURE W SMEAR: Smear Result: NONE SEEN

## 2013-05-13 MED ORDER — PROMETHAZINE-CODEINE 6.25-10 MG/5ML PO SYRP: 5.0000 mL | ORAL_SOLUTION | Freq: Four times a day (QID) | ORAL | Status: DC | PRN

## 2013-05-13 NOTE — Telephone Encounter (Signed)
Spoke with pt.  She has been using the Hycodan but is not helping.  Advised that we will call in rx for Promethazine with Codeine to phamacy.

## 2013-05-13 NOTE — Telephone Encounter (Signed)
Last OV 05-02-13. Pt states that her cough has gotten worse, especially at night. She states she has an appt on 05-24-13 for follow-up. She states last night she could not stop coughing and she had some promethazine with codeine cough syrup left over from another doctor and she took some of this and it stopped the cough and she was able to sleep all night. Pt is requesting an rx for this to be called into [facility]. Pt was given Rx for Hycodan on 04-15-13 by CY.  Please advise. Cressey Bing, CMA Allergies  Allergen Reactions  . Diflucan [Fluconazole] Hives    All over body  . Doxycycline     "makes me feel terrible" Causes a feeling of a lump in chest  . Norvasc [Amlodipine Besylate] Other (See Comments)    Edema of ankles  . Clindamycin Hcl Rash  . Zestril [Lisinopril] Cough

## 2013-05-13 NOTE — Telephone Encounter (Signed)
Has she tried the Hycodan? If it doesn't work as well, then ok to refill prometh w/ codeine 200 ml, 5 ml every 6 hours if needed for cough

## 2013-05-24 ENCOUNTER — Ambulatory Visit (INDEPENDENT_AMBULATORY_CARE_PROVIDER_SITE_OTHER): Payer: Federal, State, Local not specified - PPO | Admitting: Internal Medicine

## 2013-05-24 ENCOUNTER — Encounter: Payer: Self-pay | Admitting: Internal Medicine

## 2013-05-24 VITALS — BP 142/70 | HR 95 | Ht 68.25 in

## 2013-05-24 DIAGNOSIS — R911 Solitary pulmonary nodule: Secondary | ICD-10-CM

## 2013-05-24 DIAGNOSIS — Z23 Encounter for immunization: Secondary | ICD-10-CM

## 2013-05-24 DIAGNOSIS — Z2911 Encounter for prophylactic immunotherapy for respiratory syncytial virus (RSV): Secondary | ICD-10-CM

## 2013-05-24 DIAGNOSIS — J209 Acute bronchitis, unspecified: Secondary | ICD-10-CM

## 2013-05-24 MED ORDER — FLUTTER DEVI: Status: DC

## 2013-05-24 MED ORDER — CLARITHROMYCIN 500 MG PO TABS: ORAL_TABLET | ORAL | Status: DC

## 2013-05-24 NOTE — Assessment & Plan Note (Signed)
Apparently inflammatory. Initially grew pneumococcus. Plan- CT chest is scheduled for July, before her trip to [place]

## 2013-05-24 NOTE — Patient Instructions (Addendum)
Pneumovax- 23  Order- script for Flutter device to help clear your airways       Blow through 4 times per cycle, 3 cycles/ day  Script for Biaxin  To repeat course  Please call as needed

## 2013-05-24 NOTE — Progress Notes (Signed)
12/06/12- 45 yoF former smoker referred by Dr Dorthy Cooler for cough and shortness of breath Had smoked one pack per day for 13 years, ending in 1986. Complains of cough or shortness of breath starting in August of 2014. Had come back from vacation. Husband had sore throat that time but resolved. She got a sore throat than rapid progression with fever and chills, malaise and weakness. She was treated by her primary physician service with doxycycline and then with a Z-Pak and prednisone. She improved but cough remained productive. She then saw Dr. Inda Merlin September 18 with increased prednisone taper. Chest x-ray report from 10/21/2012 describes interval development of airspace and linear density in the left base consistent with subsegmental atelectasis or pneumonia. She was given a third Z-Pak. Cough remains productive but clear. October 24 she started a fourth Z-Pak plus Augmentin For 7 days resulting in GI upset which has slowly improved. He doxycycline earlier in the course had upset her stomach. Followup chest x-ray 11/26/2012: Persistent mild hazy left lung base opacity. "Given the lack of change this is most likely atelectasis".she still has some cough but no longer short of breath, chest pain fever. There has been no blood. Has had flu vaccine. She denies reflux or any history of DVT/PE. 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   03/18/2013 Acute OV  Pt of Dr. Annamaria Boots  .  Complains of  prod cough with light green/beige mucus, sore throat from cough, rattling in chest at night since Aug 2014.  Began Breo on 2/5 with minimal improvement beginning within the last 2 days.  Denies increased SOB, tightness, hemoptysis, nausea, vomiting, edema.  CXR 03/07/13  discoid atx in lingula .  No pets, no recent travel . No known occupational exposure.  Worse first thing in am and At bedtime   No cough during sleep.  Worse with laughing , talking and cold air.  No chest pain,orthopnea, edema or  fever.   04/15/13-  62 yoF former smoker followed for cough and shortness of breath FOLLOWS FOR:  Still having cough with thick yellow mucus and clearing throat with wheezing at times Acute bronchitis was "bad" in Feb- saw TP. Treated Prilosec for cough and instructed anti-reflux measures. Doesn't feel reflux so stopped Prilosec with no change.  Breo no help. Sputum pale green. Cough now some better with BID Delsym. No blood/ fever. 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  Electronically Signed  By: Margaree Mackintosh M.D.  On: 03/07/2013 14:50  05/02/13- 19 yoF former smoker followed for cough and shortness of breath FOLLOWS FOR: review EBUS results with patient CT indicated persistent LLL density. Dr Lamonte Sakai did EBUS for [place]. Culture grew pneumococcus> augmentin. Took biaxin F/U CT is scheduled for July    CT chest 04/25/13 IMPRESSION:  1. Persistent subsegmental consolidation and atelectasis in the  posterior left lower lobe. Although no mass is identified,  bronchoscopy may be helpful to evaluate for underlying endobronchial  lesion.  2. 3 cm nodule is identified within the left lobe of thyroid gland.  Electronically Signed  By: Kerby Moors M.D.  On: 04/21/2013 10:03 CXR 04/28/13 IMPRESSION:  1. No active cardiopulmonary disease.  2. Unchanged linear opacity in the left lower lobe. Reference chest  CT 04/21/2013.  Electronically Signed  By: Jorje Guild M.D.  On: 04/28/2013 12:51  05/24/13-63 yoF former smoker followed for cough and shortness of breath, LLL nodule, pneumococcal bronchitis,  FOLLOWS FOR: regroup appt per CY and patient.     Husband here. All cultures neg so far from 3/26 bronch, after prior course of biaxin. She felt better response to biaxin than anything else she has, tried. Now coughing less often, less hard, but still green. No blood or fever.  Throat sore this week ?  Pollen. Discussed pneumococcal bronchitis and pneumonia vaccine. Charleen Kirks cytology had shown inflammation, no malignant cells.   ROS-see HPI Constitutional:   No-   weight loss, night sweats, fevers, chills, fatigue, lassitude. HEENT:   No-  headaches, difficulty swallowing, tooth/dental problems, +sore throat,       No-  sneezing, itching, ear ache, nasal congestion, post nasal drip,  CV:  No-   chest pain, orthopnea, PND, swelling in lower extremities, anasarca, dizziness, palpitations Resp: No-   shortness of breath with exertion or at rest.              + productive cough,  No non-productive cough,  No- coughing up of blood.              No-   change in color of mucus.  No- wheezing.   Skin: No-   rash or lesions. GI:  No-   heartburn, indigestion, abdominal pain, nausea, vomiting,  GU:  MS:  No-   joint pain or swelling.   Neuro-     nothing unusual Psych:  No- change in mood or affect. No depression or anxiety.  No memory loss.  OBJ- Physical Exam General- Alert, Oriented, Affect-appropriate, Distress- none acute Skin- rash-none, lesions- none, excoriation- none Lymphadenopathy- none Head- atraumatic            Eyes- Gross vision intact, PERRLA, conjunctivae and secretions clear            Ears- Hearing, canals-normal            Nose- Clear, no-Septal dev, mucus, polyps, erosion, perforation             Throat- Mallampati II , +mucosa red/ looks more like trauma from cough than inflammation,                     drainage- none, tonsils- atrophic. +frequent throat clearing. +No thrush Neck- flexible , trachea midline, no stridor , thyroid nl, carotid no bruit Chest - symmetrical excursion , unlabored           Heart/CV- RRR , no murmur , no gallop  , no rub, nl s1 s2                           - JVD- none , edema- none, stasis changes- none, varices- none           Lung- +slight rattle L base, wheeze- none, cough+bronchitic , dullness-none, rub- none           Chest wall-  Abd-   Br/ Gen/ Rectal- Not done, not indicated Extrem- cyanosis- none, clubbing, none, atrophy- none, strength- nl Neuro- grossly intact to observation

## 2013-05-24 NOTE — Assessment & Plan Note (Signed)
Bronch 3/  / 5- nonspecific inflammation, no malignancy or aspirated material Area of bronchiectasis is likely. Atypical organisms not found Plan- Extend biaxin, Add Flutter for pulmonary toilet, pneumovax, throat lozenges/ sips.

## 2013-05-26 LAB — FUNGUS CULTURE W SMEAR: FUNGAL SMEAR: NONE SEEN

## 2013-05-28 NOTE — Assessment & Plan Note (Signed)
Plan Biaxin

## 2013-05-28 NOTE — Assessment & Plan Note (Signed)
This may have been an area of aspiration pneumonitis or chronic organizing pneumonia/ BOOP Plan-Biaxin, plan followup chest CT without contrast in 3 months

## 2013-05-28 NOTE — Assessment & Plan Note (Signed)
Attributed to chronic bronchitis

## 2013-05-29 LAB — AFB CULTURE WITH SMEAR (NOT AT ARMC): ACID FAST SMEAR: NONE SEEN

## 2013-06-10 LAB — AFB CULTURE WITH SMEAR (NOT AT ARMC): Acid Fast Smear: NONE SEEN

## 2013-07-28 ENCOUNTER — Ambulatory Visit (INDEPENDENT_AMBULATORY_CARE_PROVIDER_SITE_OTHER)
Admission: RE | Admit: 2013-07-28 | Discharge: 2013-07-28 | Disposition: A | Discharge status: Home / Self Care | Payer: Federal, State, Local not specified - PPO | Source: Ambulatory Visit | Attending: Internal Medicine | Admitting: Internal Medicine

## 2013-07-28 DIAGNOSIS — R911 Solitary pulmonary nodule: Secondary | ICD-10-CM

## 2013-07-28 NOTE — Progress Notes (Signed)
Quick Note:  Called and spoke to pt regarding results and recs per CY. Pt verbalized understanding and denied any further questions or concerns at this time. ______

## 2013-09-01 ENCOUNTER — Ambulatory Visit (INDEPENDENT_AMBULATORY_CARE_PROVIDER_SITE_OTHER): Payer: Federal, State, Local not specified - PPO | Admitting: Internal Medicine

## 2013-09-01 ENCOUNTER — Ambulatory Visit: Payer: Federal, State, Local not specified - PPO

## 2013-09-01 ENCOUNTER — Encounter: Payer: Self-pay | Admitting: Internal Medicine

## 2013-09-01 VITALS — BP 122/68 | HR 76 | Ht 69.0 in

## 2013-09-01 DIAGNOSIS — J479 Bronchiectasis, uncomplicated: Secondary | ICD-10-CM

## 2013-09-01 DIAGNOSIS — J209 Acute bronchitis, unspecified: Secondary | ICD-10-CM

## 2013-09-01 MED ORDER — SULFAMETHOXAZOLE-TMP DS 800-160 MG PO TABS: 1.0000 | ORAL_TABLET | Freq: Two times a day (BID) | ORAL | Status: DC

## 2013-09-01 NOTE — Progress Notes (Signed)
12/06/12- 63 yoF former smoker referred by Dr Dorthy Cooler for cough and shortness of breath Had smoked one pack per day for 13 years, ending in 1986. Complains of cough or shortness of breath starting in August of 2014. Had come back from vacation. Husband had sore throat that time but resolved. She got a sore throat than rapid progression with fever and chills, malaise and weakness. She was treated by her primary physician service with doxycycline and then with a Z-Pak and prednisone. She improved but cough remained productive. She then saw Dr. Inda Merlin September 18 with increased prednisone taper. Chest x-ray report from 10/21/2012 describes interval development of airspace and linear density in the left base consistent with subsegmental atelectasis or pneumonia. She was given a third Z-Pak. Cough remains productive but clear. October 24 she started a fourth Z-Pak plus Augmentin For 7 days resulting in GI upset which has slowly improved. He doxycycline earlier in the course had upset her stomach. Followup chest x-ray 11/26/2012: Persistent mild hazy left lung base opacity. "Given the lack of change this is most likely atelectasis".she still has some cough but no longer short of breath, chest pain fever. There has been no blood. Has had flu vaccine. She denies reflux or any history of DVT/PE. 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   03/18/2013 Acute OV  Pt of Dr. Annamaria Boots  .  Complains of  prod cough with light green/beige mucus, sore throat from cough, rattling in chest at night since Aug 2014.  Began Breo on 2/5 with minimal improvement beginning within the last 2 days.  Denies increased SOB, tightness, hemoptysis, nausea, vomiting, edema.  CXR 03/07/13  discoid atx in lingula .  No pets, no recent travel . No known occupational exposure.  Worse first thing in am and At bedtime   No cough during sleep.  Worse with laughing , talking and cold air.  No chest pain,orthopnea, edema or  fever.   04/15/13-  32 yoF former smoker followed for cough and shortness of breath FOLLOWS FOR:  Still having cough with thick yellow mucus and clearing throat with wheezing at times Acute bronchitis was "bad" in Feb- saw TP. Treated Prilosec for cough and instructed anti-reflux measures. Doesn't feel reflux so stopped Prilosec with no change.  Breo no help. Sputum pale green. Cough now some better with BID Delsym. No blood/ fever. 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  Electronically Signed  By: Margaree Mackintosh M.D.  On: 03/07/2013 14:50  05/02/13- 84 yoF former smoker followed for cough and shortness of breath FOLLOWS FOR: review EBUS results with patient CT indicated persistent LLL density. Dr Lamonte Sakai did EBUS for [place]. Culture grew pneumococcus> augmentin. Took biaxin F/U CT is scheduled for July    CT chest 04/25/13 IMPRESSION:  1. Persistent subsegmental consolidation and atelectasis in the  posterior left lower lobe. Although no mass is identified,  bronchoscopy may be helpful to evaluate for underlying endobronchial  lesion.  2. 3 cm nodule is identified within the left lobe of thyroid gland.  Electronically Signed  By: Kerby Moors M.D.  On: 04/21/2013 10:03 CXR 04/28/13 IMPRESSION:  1. No active cardiopulmonary disease.  2. Unchanged linear opacity in the left lower lobe. Reference chest  CT 04/21/2013.  Electronically Signed  By: Jorje Guild M.D.  On: 04/28/2013 12:51  05/24/13-63 yoF former smoker followed for cough and shortness of breath, LLL nodule, pneumococcal bronchitis,  FOLLOWS FOR: regroup appt per CY and patient.     Husband here. All cultures neg so far from 3/26 bronch, after prior course of biaxin. She felt better response to biaxin than anything else she has, tried. Now coughing less often, less hard, but still green. No blood or fever.  Throat sore this week ?  Pollen. Discussed pneumococcal bronchitis and pneumonia vaccine. Charleen Kirks cytology had shown inflammation, no malignant cells.   09/01/13- 63 yoF former smoker followed for cough and shortness of breath, LLL nodule, pneumococcal bronchitis,  Follows For: Prod cough (green) - Chest congestion - Denies sob Had bronchoscopy in March with all cultures negative. She responded clinically to Biaxin. Flutter device helped.. Since then she has taken Levaquin, Augmentin, Zithromax. Serial antibiotics associated with GI distress. We discussed potential for C.diff. CT chest 07/28/13 IMPRESSION:  1. Stable appearance of consolidations/atelectasis at the left lung  base, favoring benign process given the stability. If the patient is  at low risk for malignancy I would recommend followup CT of the  chest without contrast in 1 year.  2. Left thyroid nodule. Given patient's age and the appearance of  the nodule, ultrasound is recommended for further evaluation. This  follows ACR consensus guidelines: Managing Incidental Thyroid  Nodules Detected on Imaging: White Paper of the ACR Incidental  Thyroid Findings Committee. J Am Coll Radiol 2015; 12:143-150.  3. Fatty liver.  Electronically Signed  By: Shon Hale M.D.  On: 07/28/2013 10:50   ROS-see HPI Constitutional:   No-   weight loss, night sweats, fevers, chills, fatigue, lassitude. HEENT:   No-  headaches, difficulty swallowing, tooth/dental problems, +sore throat,       No-  sneezing, itching, ear ache, nasal congestion, post nasal drip,  CV:  No-   chest pain, orthopnea, PND, swelling in lower extremities, anasarca, dizziness, palpitations Resp: No-   shortness of breath with exertion or at rest.              + productive cough,  No non-productive cough,  No- coughing up of blood.              No-   change in color of mucus.  No- wheezing.   Skin: No-   rash or lesions. GI:  No-   heartburn, indigestion, abdominal pain, nausea, vomiting,  GU:   MS:  No-   joint pain or swelling.   Neuro-     nothing unusual Psych:  No- change in mood or affect. No depression or anxiety.  No memory loss.  OBJ- Physical Exam General- Alert, Oriented, Affect-appropriate, Distress- none acute, +over- weight Skin- rash-none, lesions- none, excoriation- none Lymphadenopathy- none Head- atraumatic            Eyes- Gross vision intact, PERRLA, conjunctivae and secretions clear            Ears- Hearing, canals-normal            Nose- Clear, no-Septal dev, mucus, polyps, erosion, perforation             Throat- Mallampati II , +mucosa red/ looks more like trauma from cough than inflammation,                     drainage- none, tonsils- atrophic. +frequent throat clearing. +No thrush Neck- flexible , trachea midline, no stridor , thyroid nl, carotid no bruit Chest - symmetrical excursion , unlabored           Heart/CV- RRR ,  no murmur , no gallop  , no rub, nl s1 s2                           - JVD- none , edema- none, stasis changes- none, varices- none           Lung- +slight rattle L base, wheeze- none, cough+loose , dullness-none, rub- none           Chest wall-  Abd-  Br/ Gen/ Rectal- Not done, not indicated Extrem- cyanosis- none, clubbing, none, atrophy- none, strength- nl Neuro- grossly intact to observation

## 2013-09-01 NOTE — Patient Instructions (Addendum)
Script for Septra sent  Order- sputum cultures- routine C&S, Fungal, AFB    Dx bronchiectasis

## 2013-09-04 LAB — RESPIRATORY CULTURE OR RESPIRATORY AND SPUTUM CULTURE

## 2013-09-04 NOTE — Assessment & Plan Note (Signed)
Becoming a chronic problem. Cultures have been negative and she has had serial antibiotics. There is bronchiectasis and an apparently inflammatory nodule in the left lower lobe Plan-reculture sputum. Consider making replacement for shoes, mandatory. Amoxicillin

## 2013-09-06 ENCOUNTER — Telehealth: Payer: Self-pay | Admitting: Internal Medicine

## 2013-09-06 MED ORDER — SULFAMETHOXAZOLE-TMP DS 800-160 MG PO TABS: 1.0000 | ORAL_TABLET | Freq: Two times a day (BID) | ORAL | Status: DC

## 2013-09-06 NOTE — Telephone Encounter (Signed)
Per SN-go ahead and give Septra DS #10 take 1 po BID no refills and follow up with CY.   Please note for our records that her sputum grew HFlu Beta Lactamase Positive and he suggests she take Zithromax or Levaquin---JUST FOR OUR RECORDS

## 2013-09-06 NOTE — Telephone Encounter (Signed)
ATC PT line busy x 3 wcb 

## 2013-09-06 NOTE — Telephone Encounter (Signed)
Pt returned call, please call back at [phone]

## 2013-09-06 NOTE — Telephone Encounter (Signed)
lmomctb x1

## 2013-09-06 NOTE — Telephone Encounter (Signed)
Called spoke with pt. Aware RX has been called in. Will forward message to Dr. Annamaria Boots for when he returns.  Please advise thanks

## 2013-09-06 NOTE — Telephone Encounter (Signed)
Leave a msg at [phone]

## 2013-09-06 NOTE — Telephone Encounter (Signed)
Per 09/01/13 OV w/ CDY: Patient Instructions      Script for Septra sent Order- sputum cultures- routine C&S, Fungal, AFB    Dx bronchiectasis   --  Called spoke w/ pt. She reports she is still taking the septra and it will finish on 09/10/13. She feels this abx is the best she has been on so far. Reports she is coughing less and able to bring up a little more sputum when she does. She thinks she needs this ABX to be extended x 5 more days. CDY on vacation. Please advise SN thanks  Allergies  Allergen Reactions  . Diflucan [Fluconazole] Hives    All over body  . Doxycycline     "makes me feel terrible" Causes a feeling of a lump in chest  . Norvasc [Amlodipine Besylate] Other (See Comments)    Edema of ankles  . Clindamycin Hcl Rash  . Zestril [Lisinopril] Cough     Current Outpatient Prescriptions on File Prior to Visit  Medication Sig Dispense Refill  . Cholecalciferol (VITAMIN D PO) Take 2,000 mg by mouth daily.      . Cyanocobalamin (VITAMIN B-12) 1000 MCG SUBL Place 2 tablets under the tongue daily.       Marland Kitchen doxazosin (CARDURA) 4 MG tablet Take 4 mg by mouth daily.       . fexofenadine (ALLEGRA) 180 MG tablet Take 180 mg by mouth daily.      . fluticasone (VERAMYST) 27.5 MCG/SPRAY nasal spray Place 2 sprays into the nose daily as needed.       Marland Kitchen ketoconazole (NIZORAL) 2 % cream Apply 1 application topically daily as needed.      Marland Kitchen losartan (COZAAR) 50 MG tablet Take 50 mg by mouth daily.      . metoprolol (TOPROL-XL) 50 MG 24 hr tablet Take 50 mg by mouth daily.      Marland Kitchen omeprazole (PRILOSEC) 40 MG capsule Take one tablet every other day (Tapering dose)      . Respiratory Therapy Supplies (FLUTTER) DEVI Blow through 4 times per cycle, 3 cycles per day  1 each  0  . sulfamethoxazole-trimethoprim (BACTRIM DS) 800-160 MG per tablet Take 1 tablet by mouth 2 (two) times daily.  20 tablet  0   No current facility-administered medications on file prior to visit.

## 2013-09-12 NOTE — Telephone Encounter (Signed)
[phone] pt returning a call

## 2013-09-12 NOTE — Telephone Encounter (Signed)
Noted. Se how she does on the Septra since this started.

## 2013-09-12 NOTE — Telephone Encounter (Signed)
lmomtcb x1 for pt 

## 2013-09-12 NOTE — Telephone Encounter (Signed)
Called and spoke with pt and she stated that she feels like she is doing better with the septra---she stated that the amount of sputum is decreasing.  Pt has 4 days left of the septra.  She will call back on Thursday or Friday to give an update to CY.

## 2013-09-15 ENCOUNTER — Telehealth: Payer: Self-pay | Admitting: Internal Medicine

## 2013-09-15 MED ORDER — SULFAMETHOXAZOLE-TMP DS 800-160 MG PO TABS: 1.0000 | ORAL_TABLET | Freq: Two times a day (BID) | ORAL | Status: DC

## 2013-09-15 NOTE — Telephone Encounter (Signed)
Spoke with the pt and notified of recs per CDY  Pt verbalized understanding  Rx was sent to pharm 

## 2013-09-15 NOTE — Telephone Encounter (Signed)
Ok to extend Septra DS # 14, 1 twice daily for 7 more days

## 2013-09-15 NOTE — Telephone Encounter (Signed)
Per 09/06/13 telephone; Elie Confer, CMA at 09/12/2013 2:58 PM     Status: Signed        Called and spoke with pt and she stated that she feels like she is doing better with the septra---she stated that the amount of sputum is decreasing. Pt has 4 days left of the septra. She will call back on Thursday or Friday to give an update to CY.   ---  Called spoke with pt. She feels the septra is really helping her. She is now coughing 50% less and also having less sputum production. She is no longer coughing up clumps of phlem but is more of jelly thin phlem. She is afraid to stop this medication and needs it extended until "the bug inside of her dies".  Please advise Dr. Annamaria Boots thanks  --if pt does not answer, please leave detailed message

## 2013-09-29 ENCOUNTER — Other Ambulatory Visit: Payer: Self-pay | Admitting: Family Medicine

## 2013-09-29 DIAGNOSIS — E041 Nontoxic single thyroid nodule: Secondary | ICD-10-CM

## 2013-09-29 LAB — FUNGUS CULTURE W SMEAR: SMEAR RESULT: NONE SEEN

## 2013-09-30 ENCOUNTER — Ambulatory Visit
Admission: RE | Admit: 2013-09-30 | Discharge: 2013-09-30 | Disposition: A | Discharge status: Home / Self Care | Payer: Federal, State, Local not specified - PPO | Source: Ambulatory Visit | Attending: Family Medicine | Admitting: Family Medicine

## 2013-09-30 DIAGNOSIS — E041 Nontoxic single thyroid nodule: Secondary | ICD-10-CM

## 2013-10-14 ENCOUNTER — Ambulatory Visit (INDEPENDENT_AMBULATORY_CARE_PROVIDER_SITE_OTHER): Payer: Federal, State, Local not specified - PPO | Admitting: Internal Medicine

## 2013-10-14 ENCOUNTER — Encounter: Payer: Self-pay | Admitting: Internal Medicine

## 2013-10-14 VITALS — BP 132/76 | HR 77 | Ht 69.0 in

## 2013-10-14 DIAGNOSIS — R053 Chronic cough: Secondary | ICD-10-CM

## 2013-10-14 DIAGNOSIS — R05 Cough: Secondary | ICD-10-CM

## 2013-10-14 DIAGNOSIS — R059 Cough, unspecified: Secondary | ICD-10-CM

## 2013-10-14 DIAGNOSIS — J0100 Acute maxillary sinusitis, unspecified: Secondary | ICD-10-CM

## 2013-10-14 DIAGNOSIS — R911 Solitary pulmonary nodule: Secondary | ICD-10-CM

## 2013-10-14 DIAGNOSIS — J209 Acute bronchitis, unspecified: Secondary | ICD-10-CM

## 2013-10-14 LAB — AFB CULTURE WITH SMEAR (NOT AT ARMC): Acid Fast Smear: NONE SEEN

## 2013-10-14 MED ORDER — METHYLPREDNISOLONE ACETATE 80 MG/ML IJ SUSP
80.0000 mg | Freq: Once | INTRAMUSCULAR | Status: AC
  Administered 2013-10-14: 80 mg via INTRAMUSCULAR

## 2013-10-14 MED ORDER — CLARITHROMYCIN 500 MG PO TABS: ORAL_TABLET | ORAL | Status: DC

## 2013-10-14 NOTE — Patient Instructions (Signed)
Script sent for Biaxin antibiotic  Neb neo nasal  Depo 80  Consider adding a decongestant like otc Sudafed-PE as needed for nasal congestion  You might also want to try using a nasal saline rinse like a Neti-Pot or squeeze bottle if you can't get cleared soon.   Get flu shot at school

## 2013-10-14 NOTE — Assessment & Plan Note (Addendum)
Chronic bronchitis. We feel she needs longer than usual course of abx in effort to clear. Plan- Biaxin

## 2013-10-14 NOTE — Assessment & Plan Note (Signed)
Plan- neb nasal decongestant, depomedrol, nasal saline rinse, biaxin

## 2013-10-14 NOTE — Assessment & Plan Note (Signed)
Appears to be scarring from chronic inflammation

## 2013-10-14 NOTE — Assessment & Plan Note (Signed)
Associated with chronic bronchitis

## 2013-10-14 NOTE — Progress Notes (Signed)
12/06/12- 45 yoF former smoker referred by Dr Dorthy Cooler for cough and shortness of breath Had smoked one pack per day for 13 years, ending in 1986. Complains of cough or shortness of breath starting in August of 2014. Had come back from vacation. Husband had sore throat that time but resolved. She got a sore throat than rapid progression with fever and chills, malaise and weakness. She was treated by her primary physician service with doxycycline and then with a Z-Pak and prednisone. She improved but cough remained productive. She then saw Dr. Inda Merlin September 18 with increased prednisone taper. Chest x-ray report from 10/21/2012 describes interval development of airspace and linear density in the left base consistent with subsegmental atelectasis or pneumonia. She was given a third Z-Pak. Cough remains productive but clear. October 24 she started a fourth Z-Pak plus Augmentin For 7 days resulting in GI upset which has slowly improved. He doxycycline earlier in the course had upset her stomach. Followup chest x-ray 11/26/2012: Persistent mild hazy left lung base opacity. "Given the lack of change this is most likely atelectasis".she still has some cough but no longer short of breath, chest pain fever. There has been no blood. Has had flu vaccine. She denies reflux or any history of DVT/PE. 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   03/18/2013 Acute OV  Pt of Dr. Annamaria Boots  .  Complains of  prod cough with light green/beige mucus, sore throat from cough, rattling in chest at night since Aug 2014.  Began Breo on 2/5 with minimal improvement beginning within the last 2 days.  Denies increased SOB, tightness, hemoptysis, nausea, vomiting, edema.  CXR 03/07/13  discoid atx in lingula .  No pets, no recent travel . No known occupational exposure.  Worse first thing in am and At bedtime   No cough during sleep.  Worse with laughing , talking and cold air.  No chest pain,orthopnea, edema or  fever.   04/15/13-  62 yoF former smoker followed for cough and shortness of breath FOLLOWS FOR:  Still having cough with thick yellow mucus and clearing throat with wheezing at times Acute bronchitis was "bad" in Feb- saw TP. Treated Prilosec for cough and instructed anti-reflux measures. Doesn't feel reflux so stopped Prilosec with no change.  Breo no help. Sputum pale green. Cough now some better with BID Delsym. No blood/ fever. 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  Electronically Signed  By: Margaree Mackintosh M.D.  On: 03/07/2013 14:50  05/02/13- 19 yoF former smoker followed for cough and shortness of breath FOLLOWS FOR: review EBUS results with patient CT indicated persistent LLL density. Dr Lamonte Sakai did EBUS for [place]. Culture grew pneumococcus> augmentin. Took biaxin F/U CT is scheduled for July    CT chest 04/25/13 IMPRESSION:  1. Persistent subsegmental consolidation and atelectasis in the  posterior left lower lobe. Although no mass is identified,  bronchoscopy may be helpful to evaluate for underlying endobronchial  lesion.  2. 3 cm nodule is identified within the left lobe of thyroid gland.  Electronically Signed  By: Kerby Moors M.D.  On: 04/21/2013 10:03 CXR 04/28/13 IMPRESSION:  1. No active cardiopulmonary disease.  2. Unchanged linear opacity in the left lower lobe. Reference chest  CT 04/21/2013.  Electronically Signed  By: Jorje Guild M.D.  On: 04/28/2013 12:51  05/24/13-63 yoF former smoker followed for cough and shortness of breath, LLL nodule, pneumococcal bronchitis,  FOLLOWS FOR: regroup appt per CY and patient.     Husband here. All cultures neg so far from 3/26 bronch, after prior course of biaxin. She felt better response to biaxin than anything else she has, tried. Now coughing less often, less hard, but still green. No blood or fever.  Throat sore this week ?  Pollen. Discussed pneumococcal bronchitis and pneumonia vaccine. Charleen Kirks cytology had shown inflammation, no malignant cells.   09/01/13- 63 yoF former smoker followed for cough and shortness of breath, LLL nodule, pneumococcal bronchitis,  Follows For: Prod cough (green) - Chest congestion - Denies sob Had bronchoscopy in March with all cultures negative. She responded clinically to Biaxin. Flutter device helped.. Since then she has taken Levaquin, Augmentin, Zithromax. Serial antibiotics associated with GI distress. We discussed potential for C.diff. CT chest 07/28/13 IMPRESSION:  1. Stable appearance of consolidations/atelectasis at the left lung  base, favoring benign process given the stability. If the patient is  at low risk for malignancy I would recommend followup CT of the  chest without contrast in 1 year.  2. Left thyroid nodule. Given patient's age and the appearance of  the nodule, ultrasound is recommended for further evaluation. This  follows ACR consensus guidelines: Managing Incidental Thyroid  Nodules Detected on Imaging: White Paper of the ACR Incidental  Thyroid Findings Committee. J Am Coll Radiol 2015; 12:143-150.  3. Fatty liver.  Electronically Signed  By: Shon Hale M.D.  On: 07/28/2013 10:50  10/14/13- 2 yof  former smoker followed for cough and shortness of breath, LLL nodule, pneumococcal bronchitis,  FOLLOWS FOR: Pt c/o sinus congestion, prod cough with green mucus.  Denies SOB. Chronic cough with green sputum basically stable. It improved for a while after 3 weeks of Septra. Sputum Cx 09/01/13 Pos for H.flu, lac+. Neg for Fungal and AFB.  Now 1 week maxillary sinus pain/pressure/ teeth hurt. [place]  ROS-see HPI Constitutional:   No-   weight loss, night sweats, fevers, chills, fatigue, lassitude. HEENT:   No-  headaches, difficulty swallowing, tooth/dental problems, +sore throat,       No-  sneezing, itching, ear ache, +nasal  congestion, post nasal drip,  CV:  No-   chest pain, orthopnea, PND, swelling in lower extremities, anasarca, dizziness, palpitations Resp: No-   shortness of breath with exertion or at rest.              + productive cough,  No non-productive cough,  No- coughing up of blood.              No-   change in color of mucus.  No- wheezing.   Skin: No-   rash or lesions. GI:  No-   heartburn, indigestion, abdominal pain, nausea, vomiting,  GU:  MS:  No-   joint pain or swelling.   Neuro-     nothing unusual Psych:  No- change in mood or affect. No depression or anxiety.  No memory loss.  OBJ- Physical Exam General- Alert, Oriented, Affect-appropriate, Distress- none acute, +over- weight Skin- rash-none, lesions- none, excoriation- none Lymphadenopathy- none Head- atraumatic            Eyes- Gross vision intact, PERRLA, conjunctivae and secretions clear            Ears- Hearing, canals-normal            Nose- Clear, no-Septal dev, mucus, polyps, erosion, perforation  Throat- Mallampati II , +mucosa red, drainage- none, tonsils- atrophic. +No thrush Neck- flexible , trachea midline, no stridor , thyroid nl, carotid no bruit Chest - symmetrical excursion , unlabored           Heart/CV- RRR , no murmur , no gallop  , no rub, nl s1 s2                           - JVD- none , edema- none, stasis changes- none, varices- none           Lung- +slight rattle L base, wheeze- none, cough+loose , dullness-none, rub- none           Chest wall-  Abd-  Br/ Gen/ Rectal- Not done, not indicated Extrem- cyanosis- none, clubbing, none, atrophy- none, strength- nl Neuro- grossly intact to observation

## 2013-11-01 ENCOUNTER — Telehealth: Payer: Self-pay | Admitting: Internal Medicine

## 2013-11-01 DIAGNOSIS — J988 Other specified respiratory disorders: Secondary | ICD-10-CM

## 2013-11-01 NOTE — Telephone Encounter (Signed)
That would be fine. We can make local referral to Infectious Disease for problem recurrent respiratory infections. If she wants [facility] referral we can do that.

## 2013-11-01 NOTE — Telephone Encounter (Signed)
Spoke with pt-- wanting to know if she can be referred to Infectious Disease to follow her for the multiple infections she has been having. Pt states that an acquaintance of hers recommended that maybe she needs to see ID. 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 Pt states that she feels she does not need another Pulmonologist. Pt states that she has 2 names in mind of Drs in mind that work ID. Please advise Dr Annamaria Boots thanks.

## 2013-11-01 NOTE — Telephone Encounter (Signed)
Called spoke with pt. She wants a local referral for now. Referral placed. Nothing further needed

## 2013-11-21 ENCOUNTER — Ambulatory Visit (INDEPENDENT_AMBULATORY_CARE_PROVIDER_SITE_OTHER): Payer: Federal, State, Local not specified - PPO | Admitting: Infectious Diseases

## 2013-11-21 ENCOUNTER — Encounter: Payer: Self-pay | Admitting: Infectious Diseases

## 2013-11-21 VITALS — BP 132/84 | HR 98 | Temp 97.6°F

## 2013-11-21 DIAGNOSIS — J209 Acute bronchitis, unspecified: Secondary | ICD-10-CM

## 2013-11-21 DIAGNOSIS — J00 Acute nasopharyngitis [common cold]: Secondary | ICD-10-CM

## 2013-11-21 NOTE — Assessment & Plan Note (Signed)
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 She does not have lab/radiographic evidence to fit this.  - We spoke about immunodeficiency syndromes. This seems unlikely as these start typically start in early childhood.  -CHF would fit with this (especially diastolic as she has minimal LE edema). Chronic PE would fit (her previous CTs did not demonstrate this).  - Due to her body size, GERD would fit well with this syndrome.  - Her sinus drainage and cough however make me most concerned that she needs allergy eval. Will ask them for their assistance.  We discussed that she does not have si/sx that suggest that she has an ongoing infection. She is not given a rx for antibiotics today.  Will be available as needed.  No pets, no bugs that she has noticed in house.

## 2013-11-21 NOTE — Progress Notes (Signed)
   Subjective:    Patient ID: Rachel Brewer, female    DOB: 1951-01-27, 63 y.o.   MRN: [id number]  HPI 63 yo F with morbid obesity, comes to ID clinic with hx of recurrent cough, bronchitis since August 2014. Has received multiple courses of anbx (doxy, z-packs). Has also received courses of prednisone.  Completed 20 days of biaxin ~11-02-13. She states she constantly coughs up green sputum. Can hear it deep in her lungs.  She had BAL 04-28-13 (AFB/routine/fungal -). She had a Cx for Strep 04-20-2013. H influenza 08-2013.  Takes fexofenadine, flonase. Takes occasional sudafed.   States that currently she is having allergies, sinus sx. But this is not the usual cause of her sx.  No hx of recurrent, childhood infections.  Had trial of prilosec Feb 2015, did not help.   Last CT: 1) consolidations/atelectasis at the left lung  base, favoring benign process given the stability. If the patient is  at low risk for malignancy I would recommend followup CT of the  chest without contrast in 1 year.  2. Left thyroid nodule. Stable since 2009.    Review of Systems  Constitutional: Positive for fatigue. Negative for fever, chills, appetite change and unexpected weight change.  HENT: Positive for rhinorrhea and sinus pressure.   Respiratory: Positive for cough.   Cardiovascular: Negative for leg swelling.  Gastrointestinal: Negative for diarrhea and constipation.  Genitourinary: Negative for difficulty urinating.   No fever or chills since August 2014.  No hx of TB exposure.     Objective:   Physical Exam  Constitutional: She appears well-developed and well-nourished.  HENT:  Mouth/Throat: Posterior oropharyngeal erythema present. No oropharyngeal exudate.  Eyes: EOM are normal. Pupils are equal, round, and reactive to light.  Neck: Neck supple.  Cardiovascular: Normal rate, regular rhythm and normal heart sounds.   Pulmonary/Chest: Effort normal. She has wheezes in the right lower field  and the left lower field.  Abdominal: Soft. Bowel sounds are normal. She exhibits no distension. There is no tenderness.  Musculoskeletal: She exhibits no edema.  Lymphadenopathy:    She has no cervical adenopathy.          Assessment & Plan:

## 2013-12-08 ENCOUNTER — Ambulatory Visit: Payer: Federal, State, Local not specified - PPO | Admitting: Internal Medicine

## 2013-12-21 IMAGING — CR DG CHEST 2V
2 series · 2 of 2 positions shown · non-contrast
Comparison: 10/21/2012

CLINICAL DATA: Chest congestion.

EXAM:
CHEST  2 VIEW

[view not recorded (1 of 2)]
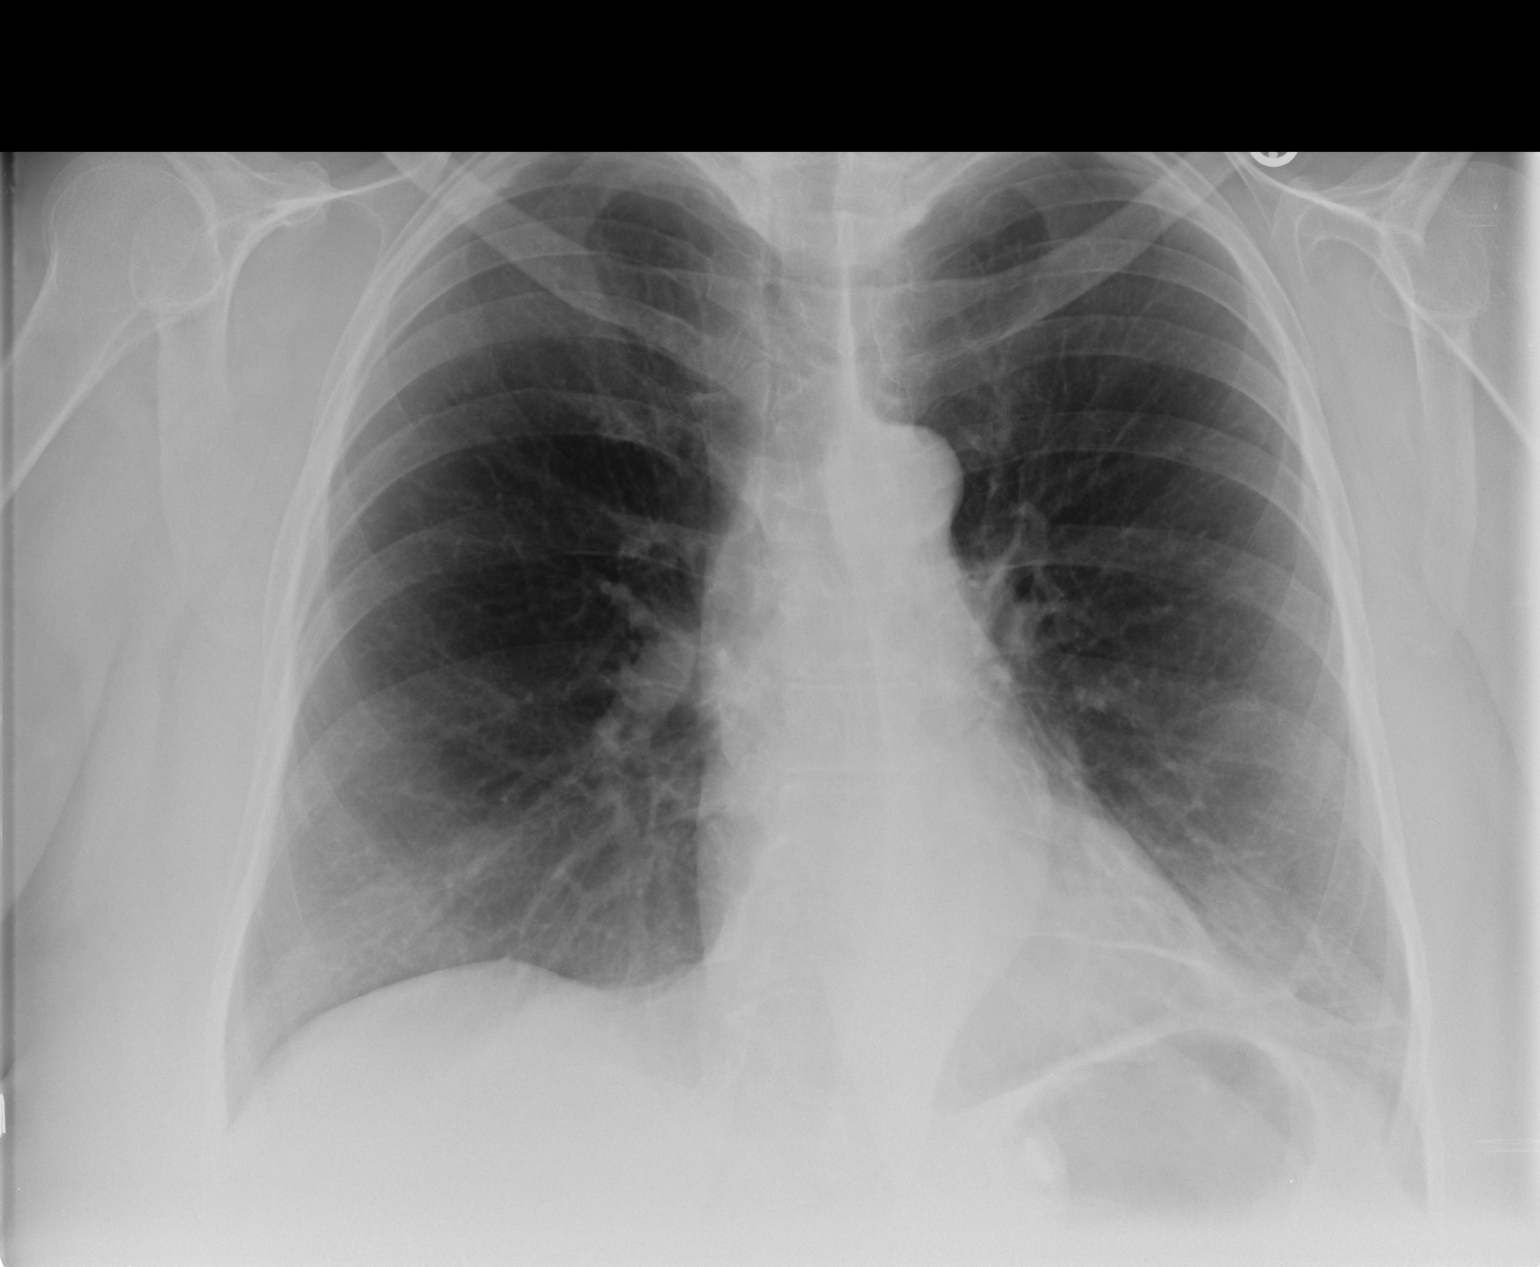

[view not recorded (2 of 2)]
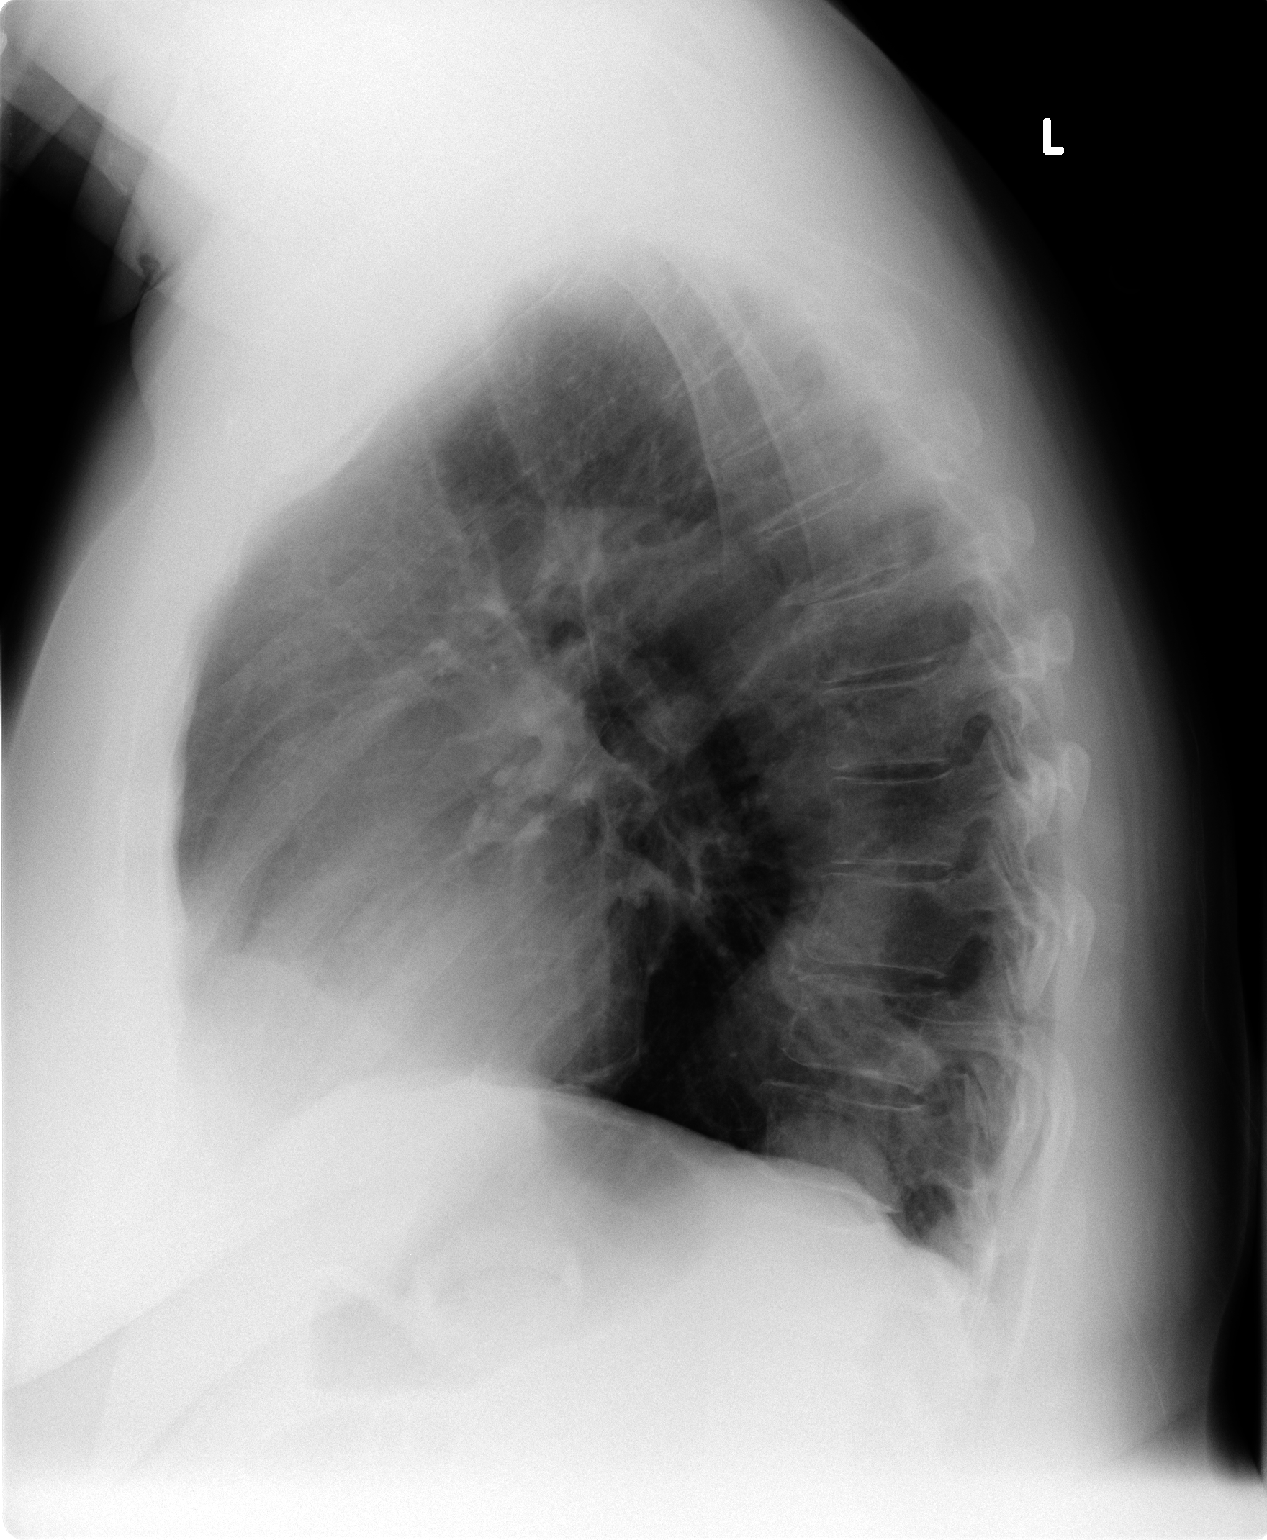

[2 of 2 positions shown; findings below may reference images not displayed]

FINDINGS: Hazy opacity in the left lower lobe is unchanged from the prior
exam. This may still reflect infiltrate or could be due to
atelectasis or a combination.

The lungs are mildly hyperexpanded but otherwise clear. No pleural
effusion or pneumothorax.

The cardiac silhouette is normal in size. The aorta is uncoiled and
tortuous.

The bony thorax is demineralized but intact.
IMPRESSION: 1. Persistent mild hazy left lung base opacity. Given the lack of
change, this is most likely atelectasis.
2. The lungs are otherwise clear.

## 2014-01-02 ENCOUNTER — Ambulatory Visit: Payer: Federal, State, Local not specified - PPO | Admitting: Internal Medicine

## 2014-01-05 ENCOUNTER — Encounter: Payer: Self-pay | Admitting: Internal Medicine

## 2014-01-11 ENCOUNTER — Other Ambulatory Visit: Payer: Self-pay | Admitting: Dermatology

## 2014-08-14 DIAGNOSIS — Z1231 Encounter for screening mammogram for malignant neoplasm of breast: Secondary | ICD-10-CM | POA: Diagnosis not present

## 2014-09-18 DIAGNOSIS — H04121 Dry eye syndrome of right lacrimal gland: Secondary | ICD-10-CM | POA: Diagnosis not present

## 2014-09-22 DIAGNOSIS — H04121 Dry eye syndrome of right lacrimal gland: Secondary | ICD-10-CM | POA: Diagnosis not present

## 2014-09-26 DIAGNOSIS — H04121 Dry eye syndrome of right lacrimal gland: Secondary | ICD-10-CM | POA: Diagnosis not present

## 2014-10-03 DIAGNOSIS — H1789 Other corneal scars and opacities: Secondary | ICD-10-CM | POA: Diagnosis not present

## 2014-10-05 DIAGNOSIS — L820 Inflamed seborrheic keratosis: Secondary | ICD-10-CM | POA: Diagnosis not present

## 2014-10-05 DIAGNOSIS — L821 Other seborrheic keratosis: Secondary | ICD-10-CM | POA: Diagnosis not present

## 2014-10-06 DIAGNOSIS — H1789 Other corneal scars and opacities: Secondary | ICD-10-CM | POA: Diagnosis not present

## 2014-10-13 DIAGNOSIS — H1789 Other corneal scars and opacities: Secondary | ICD-10-CM | POA: Diagnosis not present

## 2014-10-25 IMAGING — US US SOFT TISSUE HEAD/NECK
1 series · 14 of 25 positions shown · non-contrast
Comparison: 10/06/2007 and earlier studies

CLINICAL DATA: RIGHT THYROID NODULE, previous FNA biopsy of
dominant left lesion 10/06/2007

EXAM:
THYROID ULTRASOUND
TECHNIQUE: Ultrasound examination of the thyroid gland and adjacent soft
tissues was performed.

[Series 1: us soft tissue head/neck · 0.08mm/px · 14 of 73 slices shown]
[im 1/73]
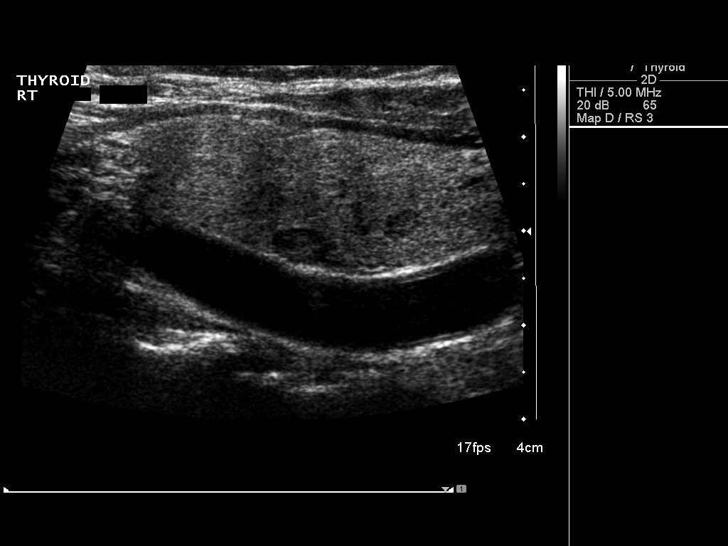
[im 7/73]
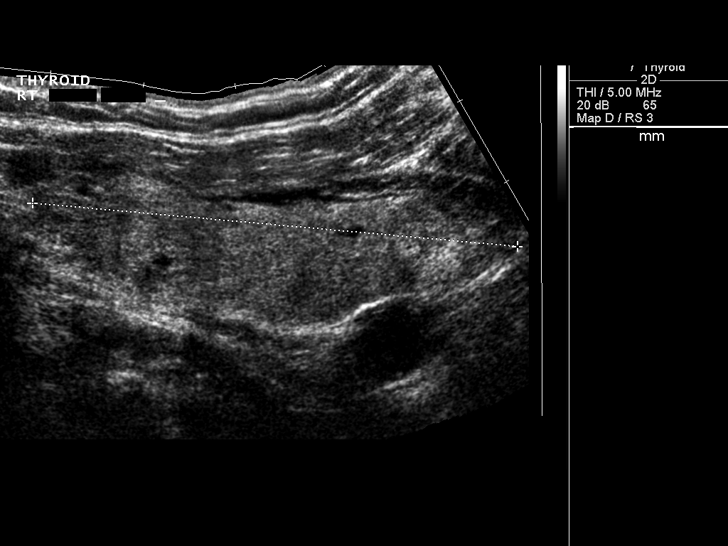
[im 13/73]
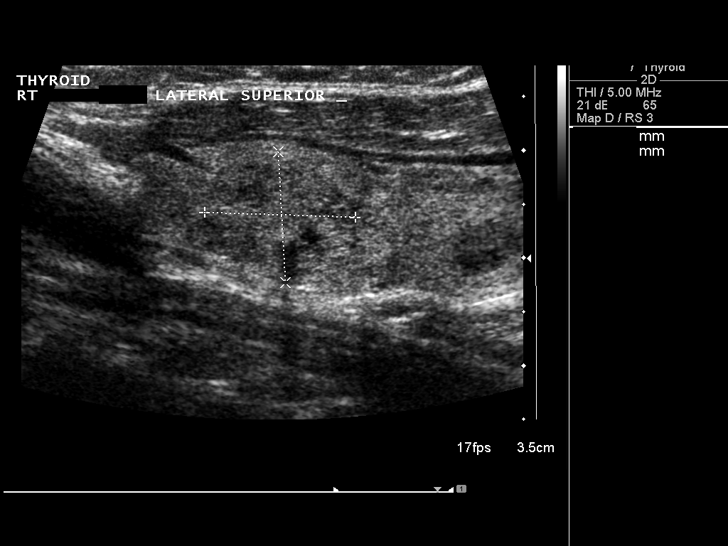
[im 19/73]
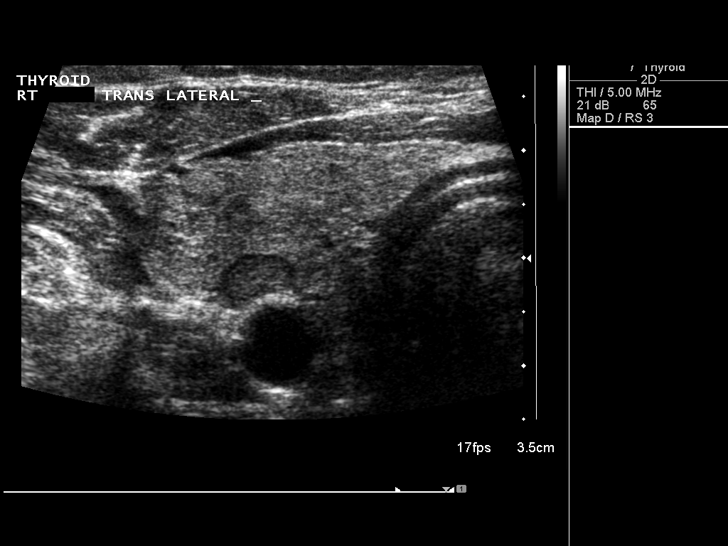
[im 25/73]
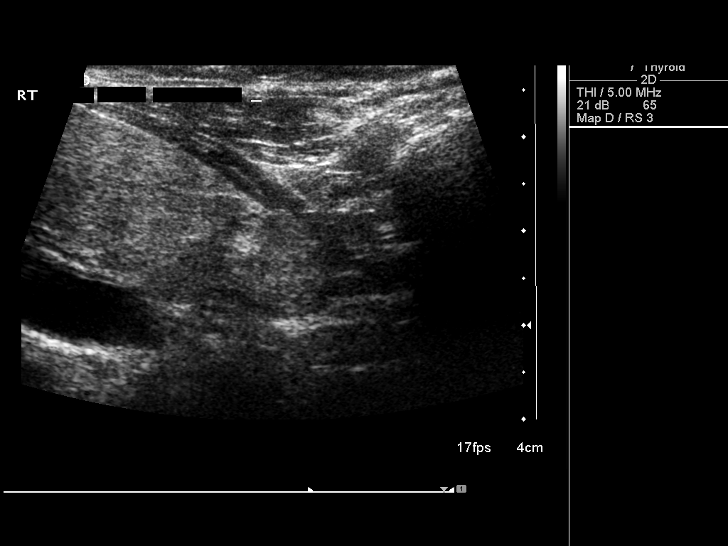
[im 28/73]
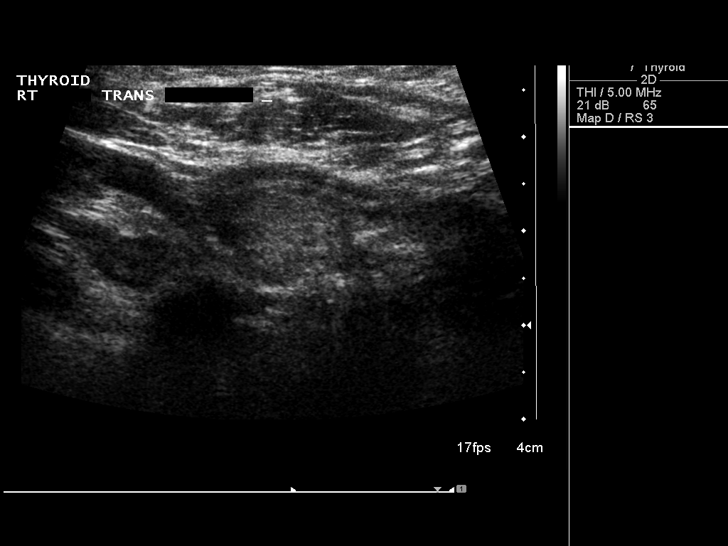
[im 34/73]
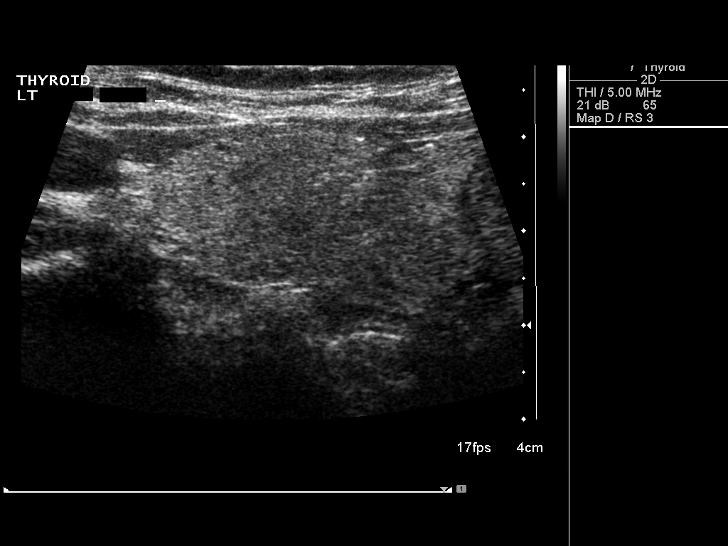
[im 40/73]
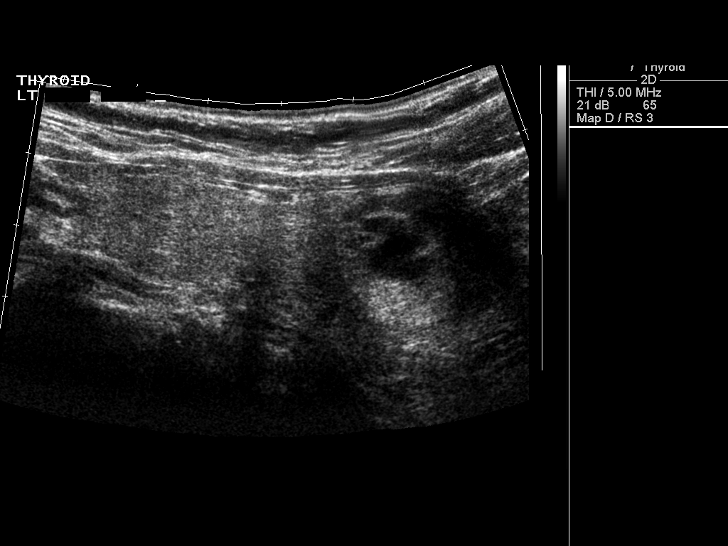
[im 46/73]
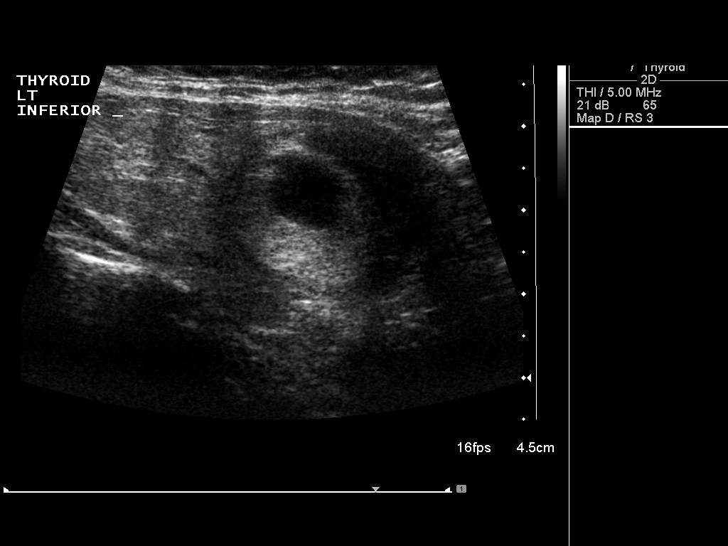
[im 49/73]
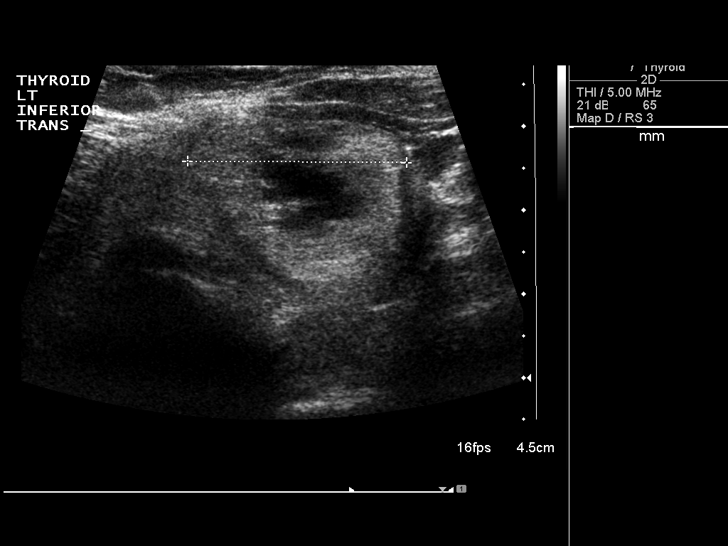
[im 55/73]
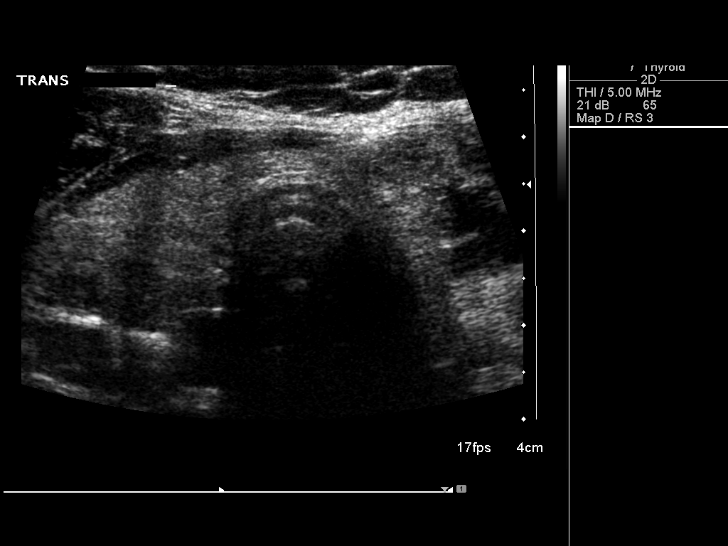
[im 61/73]
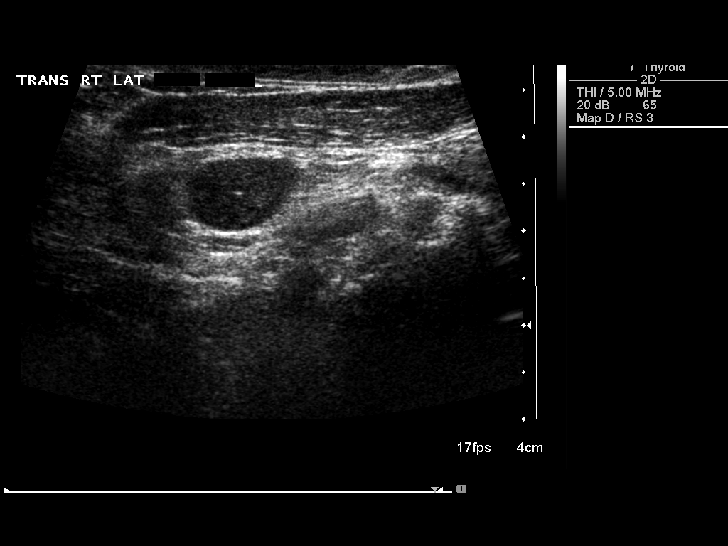
[im 67/73]
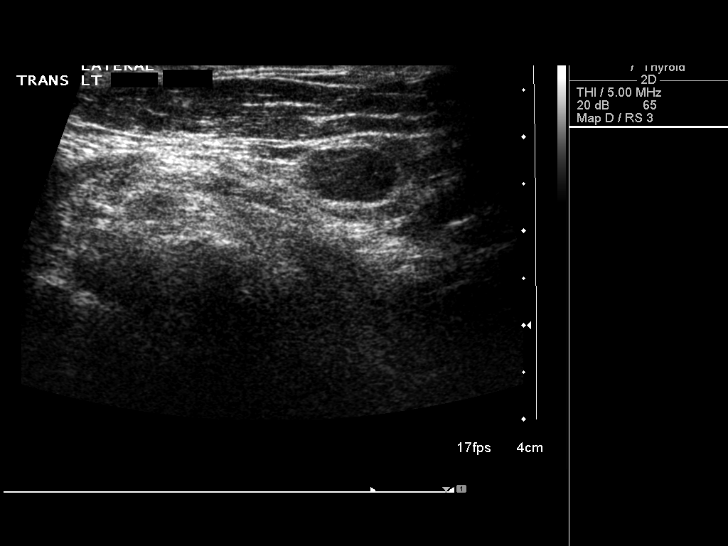
[im 73/73]
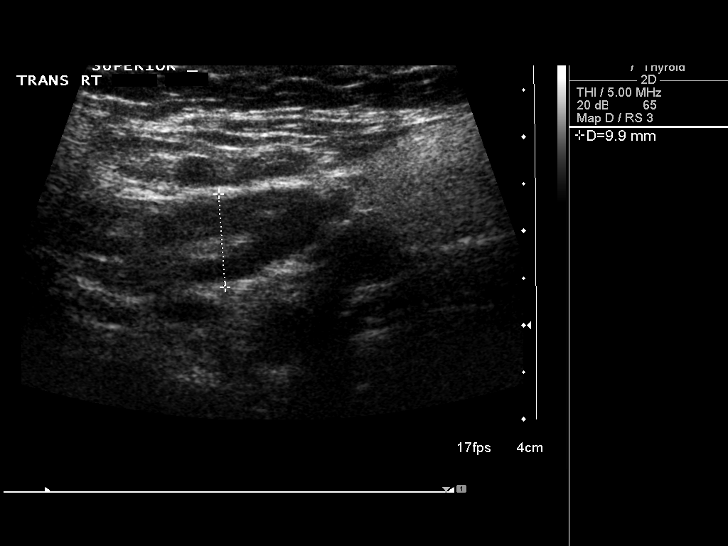

[14 of 25 positions shown; findings below may reference images not displayed]

FINDINGS: Right thyroid lobe

Measurements: 52 x 15 x 24 mm. Heterogeneous echotexture with
several small nodules. Largest 14 x 12 x 11 mm, superior pole. There
is an 11 mm nodule at the inferior pole (previously 11 mm). Two
other subcentimeter nodules.

Left thyroid lobe

Measurements: 63 x 21 x 24 mm. There is a solitary 29 x 23 x 26 mm
complex mostly solid nodule, inferior pole (previously 30 x 22 x
22).

Isthmus

Thickness: 3 mm.  No nodules visualized.

Lymphadenopathy

None visualized.
IMPRESSION: 1. Thyromegaly with bilateral nodules. No increase in dominant left
lesion. Correlate with previous biopsy results.

## 2014-11-05 DIAGNOSIS — Z23 Encounter for immunization: Secondary | ICD-10-CM | POA: Diagnosis not present

## 2014-11-28 DIAGNOSIS — H25013 Cortical age-related cataract, bilateral: Secondary | ICD-10-CM | POA: Diagnosis not present

## 2014-11-28 DIAGNOSIS — H5213 Myopia, bilateral: Secondary | ICD-10-CM | POA: Diagnosis not present

## 2014-12-18 DIAGNOSIS — R7301 Impaired fasting glucose: Secondary | ICD-10-CM | POA: Diagnosis not present

## 2014-12-18 DIAGNOSIS — M109 Gout, unspecified: Secondary | ICD-10-CM | POA: Diagnosis not present

## 2014-12-18 DIAGNOSIS — E785 Hyperlipidemia, unspecified: Secondary | ICD-10-CM | POA: Diagnosis not present

## 2014-12-18 DIAGNOSIS — I10 Essential (primary) hypertension: Secondary | ICD-10-CM | POA: Diagnosis not present

## 2014-12-21 DIAGNOSIS — E785 Hyperlipidemia, unspecified: Secondary | ICD-10-CM | POA: Diagnosis not present

## 2014-12-21 DIAGNOSIS — M109 Gout, unspecified: Secondary | ICD-10-CM | POA: Diagnosis not present

## 2014-12-21 DIAGNOSIS — R7301 Impaired fasting glucose: Secondary | ICD-10-CM | POA: Diagnosis not present

## 2014-12-21 DIAGNOSIS — I10 Essential (primary) hypertension: Secondary | ICD-10-CM | POA: Diagnosis not present

## 2014-12-21 DIAGNOSIS — J069 Acute upper respiratory infection, unspecified: Secondary | ICD-10-CM | POA: Diagnosis not present

## 2015-01-09 ENCOUNTER — Encounter: Payer: Self-pay | Admitting: Allergy and Immunology

## 2015-01-09 ENCOUNTER — Ambulatory Visit (INDEPENDENT_AMBULATORY_CARE_PROVIDER_SITE_OTHER): Payer: Medicare Other | Admitting: Allergy and Immunology

## 2015-01-09 VITALS — BP 130/84 | HR 88 | Resp 16

## 2015-01-09 DIAGNOSIS — J3089 Other allergic rhinitis: Secondary | ICD-10-CM

## 2015-01-09 DIAGNOSIS — J387 Other diseases of larynx: Secondary | ICD-10-CM

## 2015-01-09 DIAGNOSIS — K219 Gastro-esophageal reflux disease without esophagitis: Secondary | ICD-10-CM

## 2015-01-09 MED ORDER — DEXLANSOPRAZOLE 60 MG PO CPDR: 60.0000 mg | DELAYED_RELEASE_CAPSULE | ORAL | Status: DC

## 2015-01-09 MED ORDER — RANITIDINE HCL 300 MG PO TABS: 300.0000 mg | ORAL_TABLET | Freq: Every day | ORAL | Status: DC

## 2015-01-09 MED ORDER — QNASL 80 MCG/ACT NA AERS
1.0000 | INHALATION_SPRAY | Freq: Every day | NASAL | Status: DC
Start: 2015-01-09 — End: 2015-07-11

## 2015-01-09 NOTE — Progress Notes (Signed)
[facility]  Follow-up Note  Refering Provider: Lujean Amel, MD Primary Provider: Lujean Amel, MD  Subjective:   Rachel Brewer is a 64 y.o. female who returns to the [facility] in re-evaluation of the following:  HPI Comments:  Rachel Brewer returns to this clinic on 12 LPR and allergic rhinitis.Overall she's done quite well on her current medical therapy including dexilant, ranitidine, and Qnasl. One month ago she developed a head cold with nasal congestion and a cough. Although her head issue has resolved she continues to have problems with cough. She has no fever, or ugly flem, or shortness of breath or chest pain. Nor does she have any significant throat issues.   Outpatient Encounter Prescriptions as of 01/09/2015  Medication Sig  . Cholecalciferol (VITAMIN D PO) Take 2,000 mg by mouth daily.  . Cyanocobalamin (VITAMIN B-12) 1000 MCG SUBL Place 1 tablet under the tongue daily.   Marland Kitchen dexlansoprazole (DEXILANT) 60 MG capsule Take 1 capsule (60 mg total) by mouth every morning.  Marland Kitchen doxazosin (CARDURA) 4 MG tablet Take 4 mg by mouth daily.   . fexofenadine (ALLEGRA) 180 MG tablet Take 180 mg by mouth daily.  Marland Kitchen losartan (COZAAR) 50 MG tablet Take 50 mg by mouth daily.  . metoprolol (TOPROL-XL) 50 MG 24 hr tablet Take 50 mg by mouth daily.  . QNASL 80 MCG/ACT AERS Place 1 puff into the nose daily.  . ranitidine (ZANTAC) 300 MG tablet Take 1 tablet (300 mg total) by mouth at bedtime.  Marland Kitchen Respiratory Therapy Supplies (FLUTTER) DEVI Blow through 4 times per cycle, 3 cycles per day  . [DISCONTINUED] DEXILANT 60 MG capsule Take 60 mg by mouth every morning.  . [DISCONTINUED] QNASL 80 MCG/ACT AERS Place 1 puff into the nose daily.  . [DISCONTINUED] ranitidine (ZANTAC) 300 MG tablet Take 300 mg by mouth at bedtime.  Marland Kitchen ketoconazole (NIZORAL) 2 % cream Apply 1 application topically daily as needed.  . [DISCONTINUED]  fluticasone (VERAMYST) 27.5 MCG/SPRAY nasal spray Place 2 sprays into the nose daily as needed.    No facility-administered encounter medications on file as of 01/09/2015.    Meds ordered this encounter  Medications  . dexlansoprazole (DEXILANT) 60 MG capsule    Sig: Take 1 capsule (60 mg total) by mouth every morning.    Dispense:  90 capsule    Refill:  1  . QNASL 80 MCG/ACT AERS    Sig: Place 1 puff into the nose daily.    Dispense:  3 Inhaler    Refill:  1  . ranitidine (ZANTAC) 300 MG tablet    Sig: Take 1 tablet (300 mg total) by mouth at bedtime.    Dispense:  90 tablet    Refill:  1    Past Medical History  Diagnosis Date  . Allergy   . Vitamin D deficiency   . Costal chondritis   . Hypertension   . Intercostal neuralgia   . Pneumonia   . GERD (gastroesophageal reflux disease)     to prevent reflux  . Headache(784.0)     hx migraines, seasonal headache    Past Surgical History  Procedure Laterality Date  . Laparoscopic gastric banding    . Ankle fracture surgery      right - has screws and plate to reconstruct  . Tonsillectomy    . Colonoscopy    . Video bronchoscopy with endobronchial navigation N/A 04/28/2013    Procedure: VIDEO  BRONCHOSCOPY WITH ENDOBRONCHIAL NAVIGATION;  Surgeon: Collene Gobble, MD;  Location: [facility] OR;  Service: Thoracic;  Laterality: N/A;    Allergies  Allergen Reactions  . Diflucan [Fluconazole] Hives    All over body  . Doxycycline     "makes me feel terrible" Causes a feeling of a lump in chest  . Norvasc [Amlodipine Besylate] Other (See Comments)    Edema of ankles  . Clindamycin Hcl Rash  . Singulair [Montelukast Sodium] Rash  . Zestril [Lisinopril] Cough    Review of Systems  Constitutional: Negative for fever, chills and fatigue.  HENT: Negative for congestion, ear pain, facial swelling, hearing loss, nosebleeds, postnasal drip, rhinorrhea, sinus pressure, sneezing, sore throat, tinnitus, trouble swallowing and voice  change.   Eyes: Negative for pain, discharge, redness and itching.  Respiratory: Positive for cough. Negative for chest tightness, shortness of breath and wheezing.   Cardiovascular: Negative for chest pain and leg swelling.  Gastrointestinal: Negative for nausea, vomiting and abdominal pain.  Musculoskeletal: Negative for myalgias and arthralgias.  Skin: Negative for rash.  Allergic/Immunologic: Negative.   Neurological: Negative for dizziness and headaches.  Hematological: Negative for adenopathy.     Objective:   Filed Vitals:   01/09/15 1037  BP: 130/84  Pulse: 88  Resp: 16          Physical Exam  Constitutional: She appears well-developed and well-nourished. No distress.  HENT:  Head: Normocephalic and atraumatic. Head is without right periorbital erythema and without left periorbital erythema.  Right Ear: Tympanic membrane, external ear and ear canal normal. No drainage or tenderness. No foreign bodies. Tympanic membrane is not injected, not scarred, not perforated, not erythematous, not retracted and not bulging. No middle ear effusion.  Left Ear: Tympanic membrane, external ear and ear canal normal. No drainage or tenderness. No foreign bodies. Tympanic membrane is not injected, not scarred, not perforated, not erythematous, not retracted and not bulging.  No middle ear effusion.  Nose: Nose normal. No mucosal edema, rhinorrhea, nose lacerations or sinus tenderness.  No foreign bodies.  Mouth/Throat: Oropharynx is clear and moist. No oropharyngeal exudate, posterior oropharyngeal edema, posterior oropharyngeal erythema or tonsillar abscesses.  Eyes: Lids are normal. Right eye exhibits no chemosis, no discharge and no exudate. No foreign body present in the right eye. Left eye exhibits no chemosis, no discharge and no exudate. No foreign body present in the left eye. Right conjunctiva is not injected. Left conjunctiva is not injected.  Neck: Neck supple. No tracheal tenderness  present. No tracheal deviation and no edema present. No thyroid mass and no thyromegaly present.  Cardiovascular: Normal rate, regular rhythm, S1 normal and S2 normal.  Exam reveals no gallop.   No murmur heard. Pulmonary/Chest: No accessory muscle usage or stridor. No respiratory distress. She has no wheezes. She has no rhonchi. She has no rales.  Abdominal: Soft.  Lymphadenopathy:       Head (right side): No tonsillar adenopathy present.       Head (left side): No tonsillar adenopathy present.    She has no cervical adenopathy.  Neurological: She is alert.  Skin: No rash noted. She is not diaphoretic.  Psychiatric: She has a normal mood and affect. Her behavior is normal.    Diagnostics:   Assessment and Plan:   1. LPRD (laryngopharyngeal reflux disease)   2. Other allergic rhinitis      1. Prednisone 10mg  one tablet one time per day for 7 days.  2. Continue Dexilant 60 mg  in a.m. plus ranitidine 300 mg in PM  3. Continue Qnasl 80 one puff each nostril one time per day  4. Continue antihistamine and Mucinex DM if needed  5. Get fall flu vaccine  6. Return in 6 months or earlier if problem  I will assume that Jassmyn will do well with a short course of steroids to address her lingering viral induced respiratory tract inflammation. I will see her back in the clinic in 6 months or earlier if a problem while she continue on therapy for her LPR and allergic rhinitis     Allena Katz, MD [place]

## 2015-01-09 NOTE — Patient Instructions (Signed)
  1. Prednisone 10mg  one tablet one time per day for 7 days.  2. Continue Dexilant 60 mg in a.m. plus ranitidine 300 mg in PM  3. Continue Qnasl 80 one puff each nostril one time per day  4. Continue antihistamine and Mucinex DM if needed  5. Get fall flu vaccine  6. Return in 6 months or earlier if problem

## 2015-02-08 DIAGNOSIS — N950 Postmenopausal bleeding: Secondary | ICD-10-CM | POA: Diagnosis not present

## 2015-02-21 DIAGNOSIS — R87619 Unspecified abnormal cytological findings in specimens from cervix uteri: Secondary | ICD-10-CM | POA: Diagnosis not present

## 2015-02-21 DIAGNOSIS — N882 Stricture and stenosis of cervix uteri: Secondary | ICD-10-CM | POA: Diagnosis not present

## 2015-02-21 DIAGNOSIS — N950 Postmenopausal bleeding: Secondary | ICD-10-CM | POA: Diagnosis not present

## 2015-03-07 ENCOUNTER — Other Ambulatory Visit: Payer: Self-pay | Admitting: Obstetrics

## 2015-03-08 DIAGNOSIS — R87619 Unspecified abnormal cytological findings in specimens from cervix uteri: Secondary | ICD-10-CM | POA: Diagnosis not present

## 2015-03-09 ENCOUNTER — Encounter (HOSPITAL_COMMUNITY)
Admission: RE | Admit: 2015-03-09 | Discharge: 2015-03-09 | Disposition: A | Discharge status: Home / Self Care | Payer: Medicare Other | Source: Ambulatory Visit | Attending: Obstetrics | Admitting: Obstetrics

## 2015-03-09 ENCOUNTER — Encounter (HOSPITAL_COMMUNITY): Payer: Self-pay

## 2015-03-09 DIAGNOSIS — I10 Essential (primary) hypertension: Secondary | ICD-10-CM | POA: Diagnosis not present

## 2015-03-09 DIAGNOSIS — N950 Postmenopausal bleeding: Secondary | ICD-10-CM | POA: Diagnosis not present

## 2015-03-09 DIAGNOSIS — K219 Gastro-esophageal reflux disease without esophagitis: Secondary | ICD-10-CM | POA: Diagnosis not present

## 2015-03-09 DIAGNOSIS — M4802 Spinal stenosis, cervical region: Secondary | ICD-10-CM | POA: Diagnosis not present

## 2015-03-09 DIAGNOSIS — Z6841 Body Mass Index (BMI) 40.0 and over, adult: Secondary | ICD-10-CM | POA: Diagnosis not present

## 2015-03-09 DIAGNOSIS — N840 Polyp of corpus uteri: Secondary | ICD-10-CM | POA: Diagnosis not present

## 2015-03-09 DIAGNOSIS — Z87891 Personal history of nicotine dependence: Secondary | ICD-10-CM | POA: Diagnosis not present

## 2015-03-09 LAB — CBC
HCT: 40.7 % (ref 36.0–46.0)
HEMOGLOBIN: 14.4 g/dL (ref 12.0–15.0)
MCH: 29.5 pg (ref 26.0–34.0)
MCHC: 35.4 g/dL (ref 30.0–36.0)
MCV: 83.4 fL (ref 78.0–100.0)
PLATELETS: 312 10*3/uL (ref 150–400)
RBC: 4.88 MIL/uL (ref 3.87–5.11)
RDW: 13.7 % (ref 11.5–15.5)
WBC: 10 10*3/uL (ref 4.0–10.5)

## 2015-03-09 LAB — BASIC METABOLIC PANEL
ANION GAP: 10 (ref 5–15)
BUN: 14 mg/dL (ref 6–20)
CALCIUM: 9.1 mg/dL (ref 8.9–10.3)
CO2: 24 mmol/L (ref 22–32)
CREATININE: 0.89 mg/dL (ref 0.44–1.00)
Chloride: 104 mmol/L (ref 101–111)
GLUCOSE: 107 mg/dL — AB (ref 65–99)
Potassium: 4 mmol/L (ref 3.5–5.1)
Sodium: 138 mmol/L (ref 135–145)

## 2015-03-09 LAB — TYPE AND SCREEN
ABO/RH(D): B POS
ANTIBODY SCREEN: NEGATIVE

## 2015-03-09 LAB — ABO/RH: ABO/RH(D): B POS

## 2015-03-09 NOTE — Patient Instructions (Addendum)
Your procedure is scheduled on:  March 12, 2015   Enter through the Main Entrance of [facility] at: 11:00 am   Pick up the phone at the desk and dial [phone].  Call this number if you have problems the morning of surgery: [phone].  Remember: Do NOT eat food: after midnight on Sunday  Do NOT drink clear liquids after: 8:30 am  Day of surgery  Take these medicines the morning of surgery with a SIP OF WATER: Cardura, Dexilant, cozaar, toprol-xl   Do NOT wear jewelry (body piercing), metal hair clips/bobby pins, make-up, or nail polish. Do NOT wear lotions, powders, or perfumes.  You may wear deoderant. Do NOT shave for 48 hours prior to surgery. Do NOT bring valuables to the hospital. Contacts, dentures, or bridgework may not be worn into surgery.  Have a responsible adult drive you home and stay with you for 24 hours after your procedure

## 2015-03-12 ENCOUNTER — Ambulatory Visit (HOSPITAL_COMMUNITY): Payer: Medicare Other | Admitting: Anesthesiology

## 2015-03-12 ENCOUNTER — Encounter (HOSPITAL_COMMUNITY)
Admission: AD | Disposition: A | Discharge status: Home / Self Care | Payer: Self-pay | Source: Ambulatory Visit | Attending: Obstetrics

## 2015-03-12 ENCOUNTER — Ambulatory Visit (HOSPITAL_COMMUNITY)
Admission: AD | Admit: 2015-03-12 | Discharge: 2015-03-12 | Disposition: A | Discharge status: Home / Self Care | Payer: Medicare Other | Source: Ambulatory Visit | Attending: Obstetrics | Admitting: Obstetrics

## 2015-03-12 ENCOUNTER — Encounter (HOSPITAL_COMMUNITY): Payer: Self-pay | Admitting: Emergency Medicine

## 2015-03-12 DIAGNOSIS — I10 Essential (primary) hypertension: Secondary | ICD-10-CM | POA: Diagnosis not present

## 2015-03-12 DIAGNOSIS — N840 Polyp of corpus uteri: Secondary | ICD-10-CM | POA: Insufficient documentation

## 2015-03-12 DIAGNOSIS — N950 Postmenopausal bleeding: Secondary | ICD-10-CM | POA: Diagnosis not present

## 2015-03-12 DIAGNOSIS — N882 Stricture and stenosis of cervix uteri: Secondary | ICD-10-CM | POA: Diagnosis not present

## 2015-03-12 DIAGNOSIS — K219 Gastro-esophageal reflux disease without esophagitis: Secondary | ICD-10-CM | POA: Diagnosis not present

## 2015-03-12 DIAGNOSIS — M4802 Spinal stenosis, cervical region: Secondary | ICD-10-CM | POA: Diagnosis not present

## 2015-03-12 DIAGNOSIS — Z6841 Body Mass Index (BMI) 40.0 and over, adult: Secondary | ICD-10-CM | POA: Insufficient documentation

## 2015-03-12 DIAGNOSIS — Z87891 Personal history of nicotine dependence: Secondary | ICD-10-CM | POA: Diagnosis not present

## 2015-03-12 DIAGNOSIS — R87619 Unspecified abnormal cytological findings in specimens from cervix uteri: Secondary | ICD-10-CM | POA: Diagnosis not present

## 2015-03-12 HISTORY — PX: COLPOSCOPY: SHX161

## 2015-03-12 HISTORY — PX: DILATATION & CURRETTAGE/HYSTEROSCOPY WITH RESECTOCOPE: SHX5572

## 2015-03-12 SURGERY — DILATATION & CURETTAGE/HYSTEROSCOPY WITH RESECTOCOPE
Anesthesia: General

## 2015-03-12 MED ORDER — KETOROLAC TROMETHAMINE 30 MG/ML IJ SOLN
INTRAMUSCULAR | Status: DC | PRN
  Administered 2015-03-12: 30 mg via INTRAVENOUS

## 2015-03-12 MED ORDER — LIDOCAINE HCL (CARDIAC) 20 MG/ML IV SOLN
INTRAVENOUS | Status: AC
  Filled 2015-03-12: qty 5

## 2015-03-12 MED ORDER — ACETIC ACID 5 % SOLN
Status: DC | PRN
  Administered 2015-03-12: 1 via TOPICAL

## 2015-03-12 MED ORDER — LIDOCAINE HCL (CARDIAC) 20 MG/ML IV SOLN
INTRAVENOUS | Status: DC | PRN
  Administered 2015-03-12: 100 mg via INTRAVENOUS

## 2015-03-12 MED ORDER — GLYCOPYRROLATE 0.2 MG/ML IJ SOLN
INTRAMUSCULAR | Status: AC
  Filled 2015-03-12: qty 1

## 2015-03-12 MED ORDER — FENTANYL CITRATE (PF) 100 MCG/2ML IJ SOLN
INTRAMUSCULAR | Status: DC | PRN
  Administered 2015-03-12 (×2): 25 ug via INTRAVENOUS
  Administered 2015-03-12: 50 ug via INTRAVENOUS

## 2015-03-12 MED ORDER — PROPOFOL 10 MG/ML IV BOLUS
INTRAVENOUS | Status: AC
  Filled 2015-03-12: qty 20

## 2015-03-12 MED ORDER — ONDANSETRON HCL 4 MG/2ML IJ SOLN
INTRAMUSCULAR | Status: AC
  Filled 2015-03-12: qty 2

## 2015-03-12 MED ORDER — ONDANSETRON HCL 4 MG/2ML IJ SOLN
INTRAMUSCULAR | Status: DC | PRN
Start: 2015-03-12 — End: 2015-03-12
  Administered 2015-03-12: 4 mg via INTRAVENOUS

## 2015-03-12 MED ORDER — MIDAZOLAM HCL 2 MG/2ML IJ SOLN
INTRAMUSCULAR | Status: DC | PRN
  Administered 2015-03-12 (×2): 1 mg via INTRAVENOUS

## 2015-03-12 MED ORDER — FENTANYL CITRATE (PF) 100 MCG/2ML IJ SOLN
25.0000 ug | INTRAMUSCULAR | Status: DC | PRN
  Administered 2015-03-12: 25 ug via INTRAVENOUS

## 2015-03-12 MED ORDER — IBUPROFEN 600 MG PO TABS: 600.0000 mg | ORAL_TABLET | Freq: Four times a day (QID) | ORAL | Status: DC | PRN

## 2015-03-12 MED ORDER — FENTANYL CITRATE (PF) 100 MCG/2ML IJ SOLN
INTRAMUSCULAR | Status: AC
  Administered 2015-03-12: 25 ug via INTRAVENOUS
  Filled 2015-03-12: qty 2

## 2015-03-12 MED ORDER — DEXAMETHASONE SODIUM PHOSPHATE 10 MG/ML IJ SOLN
INTRAMUSCULAR | Status: AC
  Filled 2015-03-12: qty 1

## 2015-03-12 MED ORDER — SODIUM CHLORIDE 0.9 % IR SOLN
Status: DC | PRN
  Administered 2015-03-12: 3000 mL

## 2015-03-12 MED ORDER — PROPOFOL 10 MG/ML IV BOLUS
INTRAVENOUS | Status: DC | PRN
  Administered 2015-03-12: 200 mg via INTRAVENOUS

## 2015-03-12 MED ORDER — CHLOROPROCAINE HCL 1 % IJ SOLN
INTRAMUSCULAR | Status: AC
  Filled 2015-03-12: qty 30

## 2015-03-12 MED ORDER — METOCLOPRAMIDE HCL 5 MG/ML IJ SOLN: 10.0000 mg | Freq: Once | INTRAMUSCULAR | Status: DC | PRN

## 2015-03-12 MED ORDER — LACTATED RINGERS IV SOLN
INTRAVENOUS | Status: DC
  Administered 2015-03-12 (×2): via INTRAVENOUS

## 2015-03-12 MED ORDER — FENTANYL CITRATE (PF) 100 MCG/2ML IJ SOLN
INTRAMUSCULAR | Status: AC
  Filled 2015-03-12: qty 2

## 2015-03-12 MED ORDER — OXYCODONE-ACETAMINOPHEN 5-325 MG PO TABS: 1.0000 | ORAL_TABLET | Freq: Four times a day (QID) | ORAL | Status: DC | PRN

## 2015-03-12 MED ORDER — GLYCOPYRROLATE 0.2 MG/ML IJ SOLN
INTRAMUSCULAR | Status: DC | PRN
  Administered 2015-03-12: 0.2 mg via INTRAVENOUS

## 2015-03-12 MED ORDER — HYDROCODONE-ACETAMINOPHEN 7.5-325 MG PO TABS: 1.0000 | ORAL_TABLET | Freq: Once | ORAL | Status: DC | PRN

## 2015-03-12 MED ORDER — VASOPRESSIN 20 UNIT/ML IV SOLN
INTRAVENOUS | Status: AC
  Filled 2015-03-12: qty 1

## 2015-03-12 MED ORDER — DEXAMETHASONE SODIUM PHOSPHATE 4 MG/ML IJ SOLN
INTRAMUSCULAR | Status: AC
  Filled 2015-03-12: qty 1

## 2015-03-12 MED ORDER — DEXAMETHASONE SODIUM PHOSPHATE 10 MG/ML IJ SOLN
INTRAMUSCULAR | Status: DC | PRN
  Administered 2015-03-12: 10 mg via INTRAVENOUS

## 2015-03-12 MED ORDER — BUPIVACAINE HCL (PF) 0.5 % IJ SOLN
INTRAMUSCULAR | Status: AC
  Filled 2015-03-12: qty 30

## 2015-03-12 MED ORDER — MIDAZOLAM HCL 2 MG/2ML IJ SOLN
INTRAMUSCULAR | Status: AC
  Filled 2015-03-12: qty 2

## 2015-03-12 MED ORDER — MEPERIDINE HCL 25 MG/ML IJ SOLN: 6.2500 mg | INTRAMUSCULAR | Status: DC | PRN

## 2015-03-12 MED ORDER — KETOROLAC TROMETHAMINE 30 MG/ML IJ SOLN
INTRAMUSCULAR | Status: AC
  Filled 2015-03-12: qty 1

## 2015-03-12 MED ORDER — ACETIC ACID 5 % SOLN
Status: AC
  Filled 2015-03-12: qty 500

## 2015-03-12 SURGICAL SUPPLY — 20 items
CANISTER SUCT 3000ML (MISCELLANEOUS) ×3 IMPLANT
CATH ROBINSON RED A/P 16FR (CATHETERS) ×3 IMPLANT
CLOTH BEACON ORANGE TIMEOUT ST (SAFETY) ×3 IMPLANT
CONTAINER PREFILL 10% NBF 60ML (FORM) ×9 IMPLANT
DILATOR CANAL MILEX (MISCELLANEOUS) ×3 IMPLANT
ELECT REM PT RETURN 9FT ADLT (ELECTROSURGICAL) ×3
ELECTRODE REM PT RTRN 9FT ADLT (ELECTROSURGICAL) ×1 IMPLANT
GLOVE BIO SURGEON STRL SZ 6.5 (GLOVE) ×2 IMPLANT
GLOVE BIO SURGEONS STRL SZ 6.5 (GLOVE) ×1
GLOVE BIOGEL PI IND STRL 7.0 (GLOVE) ×2 IMPLANT
GLOVE BIOGEL PI INDICATOR 7.0 (GLOVE) ×4
GOWN STRL REUS W/TWL LRG LVL3 (GOWN DISPOSABLE) ×6 IMPLANT
LOOP ANGLED CUTTING 22FR (CUTTING LOOP) IMPLANT
PACK VAGINAL MINOR WOMEN LF (CUSTOM PROCEDURE TRAY) ×3 IMPLANT
PAD OB MATERNITY 4.3X12.25 (PERSONAL CARE ITEMS) ×3 IMPLANT
STENT BALLN UTERINE 4CM 6FR (STENTS) IMPLANT
TOWEL OR 17X24 6PK STRL BLUE (TOWEL DISPOSABLE) ×6 IMPLANT
TUBING AQUILEX INFLOW (TUBING) ×3 IMPLANT
TUBING AQUILEX OUTFLOW (TUBING) ×3 IMPLANT
WATER STERILE IRR 1000ML POUR (IV SOLUTION) ×3 IMPLANT

## 2015-03-12 NOTE — Discharge Instructions (Signed)
Hysteroscopy, Care After  Do NOT take Motrin/Ibuprofen until after 6:45 PM.  Refer to this sheet in the next few weeks. These instructions provide you with information on caring for yourself after your procedure. Your health care provider may also give you more specific instructions. Your treatment has been planned according to current medical practices, but problems sometimes occur. Call your health care provider if you have any problems or questions after your procedure.  WHAT TO EXPECT AFTER THE PROCEDURE After your procedure, it is typical to have the following:  You may have some cramping. This normally lasts for a couple days.  You may have bleeding. This can vary from light spotting for a few days to menstrual-like bleeding for 3-7 days. HOME CARE INSTRUCTIONS  Rest for the first 1-2 days after the procedure.  Only take over-the-counter or prescription medicines as directed by your health care provider. Do not take aspirin. It can increase the chances of bleeding.  Take showers instead of baths for 2 weeks or as directed by your health care provider.  Do not drive for 24 hours or as directed.  Do not drink alcohol while taking pain medicine.  Do not use tampons, douche, or have sexual intercourse for 2 weeks or until your health care provider says it is okay.  Take your temperature twice a day for 4-5 days. Write it down each time.  Follow your health care provider's advice about diet, exercise, and lifting.  If you develop constipation, you may:  Take a mild laxative if your health care provider approves.  Add bran foods to your diet.  Drink enough fluids to keep your urine clear or pale yellow.  Try to have someone with you or available to you for the first 24-48 hours, especially if you were given a general anesthetic.  Follow up with your health care provider as directed. SEEK MEDICAL CARE IF:  You feel dizzy or lightheaded.  You feel sick to your stomach  (nauseous).  You have abnormal vaginal discharge.  You have a rash.  You have pain that is not controlled with medicine. SEEK IMMEDIATE MEDICAL CARE IF:  You have bleeding that is heavier than a normal menstrual period.  You have a fever.  You have increasing cramps or pain, not controlled with medicine.  You have new belly (abdominal) pain.  You pass out.  You have pain in the tops of your shoulders (shoulder strap areas).  You have shortness of breath.   This information is not intended to replace advice given to you by your health care provider. Make sure you discuss any questions you have with your health care provider.   Document Released: 11/10/2012 Document Reviewed: 11/10/2012 Elsevier Interactive Patient Education 2016 Else[employer] Dilation and Curettage or Vacuum Curettage, Care After Refer to this sheet in the next few weeks. These instructions provide you with information on caring for yourself after your procedure. Your health care provider may also give you more specific instructions. Your treatment has been planned according to current medical practices, but problems sometimes occur. Call your health care provider if you have any problems or questions after your procedure. WHAT TO EXPECT AFTER THE PROCEDURE After your procedure, it is typical to have light cramping and bleeding. This may last for 2 days to 2 weeks after the procedure. HOME CARE INSTRUCTIONS   Do not drive for 24 hours.  Wait 1 week before returning to strenuous activities.  Take your temperature 2 times a day for 4  days and write it down. Provide these temperatures to your health care provider if you develop a fever.  Avoid long periods of standing.  Avoid heavy lifting, pushing, or pulling. Do not lift anything heavier than 10 pounds (4.5 kg).  Limit stair climbing to once or twice a day.  Take rest periods often.  You may resume your usual diet.  Drink enough fluids to keep your  urine clear or pale yellow.  Your usual bowel function should return. If you have constipation, you may:  Take a mild laxative with permission from your health care provider.  Add fruit and bran to your diet.  Drink more fluids.  Take showers instead of baths until your health care provider gives you permission to take baths.  Do not go swimming or use a hot tub until your health care provider approves.  Try to have someone with you or available to you the first 24-48 hours, especially if you were given a general anesthetic.  Do not douche, use tampons, or have sex (intercourse) for 2 weeks after the procedure.  Only take over-the-counter or prescription medicines as directed by your health care provider. Do not take aspirin. It can cause bleeding.  Follow up with your health care provider as directed. SEEK MEDICAL CARE IF:   You have increasing cramps or pain that is not relieved with medicine.  You have abdominal pain that does not seem to be related to the same area of earlier cramping and pain.  You have bad smelling vaginal discharge.  You have a rash.  You are having problems with any medicine. SEEK IMMEDIATE MEDICAL CARE IF:   You have bleeding that is heavier than a normal menstrual period.  You have a fever.  You have chest pain.  You have shortness of breath.  You feel dizzy or feel like fainting.  You pass out.  You have pain in your shoulder strap area.  You have heavy vaginal bleeding with or without blood clots. MAKE SURE YOU:   Understand these instructions.  Will watch your condition.  Will get help right away if you are not doing well or get worse.   This information is not intended to replace advice given to you by your health care provider. Make sure you discuss any questions you have with your health care provider.   Document Released: 01/18/2000 Document Revised: 01/25/2013 Document Reviewed: 08/19/2012 Elsevier Interactive Patient  Education [employer].

## 2015-03-12 NOTE — Anesthesia Postprocedure Evaluation (Signed)
Anesthesia Post Note  Patient: Rachel Brewer  Procedure(s) Performed: Procedure(s) (LRB): DILATATION & CURETTAGE/HYSTEROSCOPY  with endometrial polypectomy (N/A) COLPOSCOPY With Cervical Biopsies (N/A)  Patient location during evaluation: PACU Anesthesia Type: General Level of consciousness: awake and alert and oriented Pain management: pain level controlled Vital Signs Assessment: post-procedure vital signs reviewed and stable Respiratory status: spontaneous breathing, nonlabored ventilation and respiratory function stable Cardiovascular status: blood pressure returned to baseline and stable Postop Assessment: no signs of nausea or vomiting Anesthetic complications: no    Last Vitals:  Filed Vitals:   03/12/15 1315 03/12/15 1330  BP: 130/85 126/71  Pulse: 60 53  Temp:    Resp: 16 14    Last Pain:  Filed Vitals:   03/12/15 1333  PainSc: 4                  Jeanene Mena A.

## 2015-03-12 NOTE — Transfer of Care (Signed)
Immediate Anesthesia Transfer of Care Note  Patient: Rachel Brewer  Procedure(s) Performed: Procedure(s): DILATATION & CURETTAGE/HYSTEROSCOPY  with endometrial polypectomy (N/A) COLPOSCOPY With Cervical Biopsies (N/A)  Patient Location: PACU  Anesthesia Type:General  Level of Consciousness: awake, alert  and oriented  Airway & Oxygen Therapy: Patient Spontanous Breathing and Patient connected to nasal cannula oxygen  Post-op Assessment: Report given to RN, Post -op Vital signs reviewed and stable and Patient moving all extremities X 4  Post vital signs: Reviewed and stable  Last Vitals:  Filed Vitals:   03/12/15 1115  BP: 139/97  Pulse: 75  Temp: 36.6 C  Resp: 18    Complications: No apparent anesthesia complications

## 2015-03-12 NOTE — H&P (Signed)
CC: post-menopausal bleeding. Cervical stenosis, AGUS pap.  HPI: 65 yo G0 with PMB Dec 2016, unable to access cervical canal due to stenosis but EMS only 4.8 mm and decision made for expectant management. Uterus 6x3x2 cm. Pap at that time returned as AGUS. Pt now presenting for colpo with bx and ECC to eval the AGUS pap and cervical dilaation with biopsy, probable hysteroscopy if able, to fully eval the PMB.  Remote h/o abnl pap but no pap i many yrs.   Past Medical History  Diagnosis Date  . Allergy   . Vitamin D deficiency   . Costal chondritis   . Hypertension   . Intercostal neuralgia   . Pneumonia   . GERD (gastroesophageal reflux disease)     to prevent reflux  . Headache(784.0)     hx migraines, seasonal headache   morbid obesity  Past Surgical History  Procedure Laterality Date  . Laparoscopic gastric banding    . Ankle fracture surgery      right - has screws and plate to reconstruct  . Tonsillectomy    . Colonoscopy    . Video bronchoscopy with endobronchial navigation N/A 04/28/2013    Procedure: VIDEO BRONCHOSCOPY WITH ENDOBRONCHIAL NAVIGATION;  Surgeon: Collene Gobble, MD;  Location: [facility] OR;  Service: Thoracic;  Laterality: N/A;    All: multiple  PE: Filed Vitals:   03/12/15 1115  BP: 139/97  Pulse: 75  Temp: 97.8 F (36.6 C)  TempSrc: Oral  Resp: 18  SpO2: 98%    Gen: well appearing, obese, no distress Abd: soft, nT, NT LE: NT, no edema GU: def to OR  A/P: AGUS pap and PMB in setting of cervical stenosis Plan colpo with bx and ECC for AGUS Plan D&C, hysteroscopy for PMB R/B reviewed with pt   Rachel Brewer A. 03/12/2015 11:10 AM

## 2015-03-12 NOTE — Anesthesia Preprocedure Evaluation (Addendum)
Anesthesia Evaluation  Patient identified by MRN, date of birth, ID band Patient awake    Reviewed: Allergy & Precautions, H&P , NPO status , Patient's Chart, lab work & pertinent test results, reviewed documented beta blocker date and time   Airway Mallampati: II  TM Distance: >3 FB Neck ROM: full    Dental no notable dental hx. (+) Teeth Intact   Pulmonary former smoker,    Pulmonary exam normal        Cardiovascular hypertension, Pt. on medications and Pt. on home beta blockers Normal cardiovascular exam     Neuro/Psych negative psych ROS   GI/Hepatic Neg liver ROS, GERD  Medicated and Controlled,  Endo/Other  Morbid obesity  Renal/GU negative Renal ROS     Musculoskeletal   Abdominal (+) + obese,   Peds  Hematology negative hematology ROS (+)   Anesthesia Other Findings   Reproductive/Obstetrics negative OB ROS                            Anesthesia Physical Anesthesia Plan  ASA: III  Anesthesia Plan: General   Post-op Pain Management:    Induction: Intravenous  Airway Management Planned: LMA  Additional Equipment:   Intra-op Plan:   Post-operative Plan:   Informed Consent: I have reviewed the patients History and Physical, chart, labs and discussed the procedure including the risks, benefits and alternatives for the proposed anesthesia with the patient or authorized representative who has indicated his/her understanding and acceptance.     Plan Discussed with: CRNA and Surgeon  Anesthesia Plan Comments:         Anesthesia Quick Evaluation

## 2015-03-12 NOTE — Anesthesia Procedure Notes (Signed)
Procedure Name: LMA Insertion Date/Time: 03/12/2015 12:07 PM Performed by: Vansh Reckart A Pre-anesthesia Checklist: Patient identified, Emergency Drugs available, Suction available and Patient being monitored Patient Re-evaluated:Patient Re-evaluated prior to inductionOxygen Delivery Method: Circle system utilized Preoxygenation: Pre-oxygenation with 100% oxygen Intubation Type: IV induction Ventilation: Mask ventilation without difficulty and Mask ventilation with difficulty LMA: LMA inserted LMA Size: 4.0 Number of attempts: 1 Tube secured with: Tape Dental Injury: Teeth and Oropharynx as per pre-operative assessment

## 2015-03-12 NOTE — Brief Op Note (Signed)
03/12/2015  2:52 PM  PATIENT:  Rachel Brewer  65 y.o. female  PRE-OPERATIVE DIAGNOSIS:  Postmenopausal Bleeding, AGUS pap, Cervical Stenosis  POST-OPERATIVE DIAGNOSIS:  Postmenopausal Bleeding, AGUS pap, Cervical Stenosis  PROCEDURE:  Procedure(s): DILATATION & CURETTAGE/HYSTEROSCOPY  with endometrial polypectomy (N/A) COLPOSCOPY With Cervical Biopsies (N/A) endocervical curettage  SURGEON:  Surgeon(s) and Role:    * Aloha Gell, MD - Primary  PHYSICIAN ASSISTANT:   ASSISTANTS: none   ANESTHESIA:   general  EBL:  Total I/O In: 1400 [I.V.:1400] Out: 10 [Urine:10]  BLOOD ADMINISTERED:none  DRAINS: none   LOCAL MEDICATIONS USED:  NONE  SPECIMEN:  Source of Specimen:  Endometrial polyp, endometrial curettings, endocervical curettage, cervical biopsy  DISPOSITION OF SPECIMEN:  PATHOLOGY  COUNTS:  YES  TOURNIQUET:  * No tourniquets in log *  DICTATION: .Note written in EPIC  PLAN OF CARE: Discharge to home after PACU  PATIENT DISPOSITION:  PACU - hemodynamically stable.   Delay start of Pharmacological VTE agent (>24hrs) due to surgical blood loss or risk of bleeding: yes

## 2015-03-12 NOTE — Op Note (Signed)
03/12/2015  2:52 PM  PATIENT:  Rachel Brewer  65 y.o. female  PRE-OPERATIVE DIAGNOSIS:  Postmenopausal Bleeding, AGUS pap, Cervical Stenosis  POST-OPERATIVE DIAGNOSIS:  Postmenopausal Bleeding, AGUS pap, Cervical Stenosis  PROCEDURE:  Procedure(s): DILATATION & CURETTAGE/HYSTEROSCOPY  with endometrial polypectomy (N/A) COLPOSCOPY With Cervical Biopsies (N/A) endocervical curettage  SURGEON:  Surgeon(s) and Role:    * Aloha Gell, MD - Primary  PHYSICIAN ASSISTANT:   ASSISTANTS: none   ANESTHESIA:   general  EBL:  Total I/O In: 1400 [I.V.:1400] Out: 10 [Urine:10]  BLOOD ADMINISTERED:none  DRAINS: none   LOCAL MEDICATIONS USED:  NONE  SPECIMEN:  Source of Specimen:  Endometrial polyp, endometrial curettings, endocervical curettage, cervical biopsy  DISPOSITION OF SPECIMEN:  PATHOLOGY  COUNTS:  YES  TOURNIQUET:  * No tourniquets in log *  DICTATION: .Note written in EPIC  PLAN OF CARE: Discharge to home after PACU  PATIENT DISPOSITION:  PACU - hemodynamically stable.   Delay start of Pharmacological VTE agent (>24hrs) due to surgical blood loss or risk of bleeding: yes  Antibiotics: None Complications: None   Indications: This is a 65 year old G0 patient with postmenopausal bleeding and a Pap smear with atypical glandular cells of undetermined significance. Attempted endometrial biopsy in the office was unsuccessful due to significant cervical stenosis. Patient presents today for colposcopy, endocervical curettage, D&C, hysteroscopy with Cytotec preparation.   After informed consent including discussion of risks of bleeding, infection, uterine perforation and need for further surgery , the patient was taken to the operating room where general anesthesia was initiated without difficulty. She was prepped and draped in normal sterile fashion in the dorsal supine lithotomy position.   A straight catheter was done for clear urine  Sterile speculum was  inserted into the vagina and acetic acid was used to paint the cervix the cervix was examined colposcopically under high-power magnification no significant acetowhite epithelial changes were noted, no vessel changes or green light filter changes were noted. Overall the vagina and external cervix appeared atrophic. Cervical os was markedly stenotic and no colposcopic evaluation of the endocervical curette canal was done . A single biopsy with a Tischler was done at 12:00 . No bleeding was noted . The colposcopy portion of the procedure was complete and the colposcope was removed. Gloves were changed   A tenaculum was placed on the cervical tissue at 12:00. The cervical os was markedly stenotic and using lacrimal duct dilators and then cervical os finder the cervix was serially dilated. Once the os was opened a small amount of whitish discharge extruded from the os . An endocervical curettage was carried out and sent to pathology. The cervix was then dilated to another bur 55 Pratt and a small hysteroscope was advanced through the cervix into the uterine cavity. A small endometrial polyp was seen in the anterior lower uterine segment. Some increased vascularity and fluffy endometrium were noted in the cornua bilaterally. No additional focal pathology was noted. A sharp curettage was carried out and specimen sent to pathology. Using polyp graspers and several attempts the polyp graspers were placed into the cervical canal directed anteriorly towards the lower segment anterior wall and polyp was removed in 2 separate pieces. Repeat hysteroscopy was done throughout the procedure to ensure adequate sampling and complete removal of the endometrial polyp. Hysteroscopic evaluation was done at the end of the procedures well to ensure no perforation no bleeding no additional pathology.  The hysteroscope was then removed. Tenaculum was removed. The tenaculum site was hemostatic  and the case was terminated.  Patient  tolerated the procedure well sponge lap and needle counts are correct 3.  Richad Ramsay A. 03/12/2015 3:00 PM

## 2015-03-13 ENCOUNTER — Encounter (HOSPITAL_COMMUNITY): Payer: Self-pay | Admitting: Obstetrics

## 2015-03-28 DIAGNOSIS — N840 Polyp of corpus uteri: Secondary | ICD-10-CM | POA: Diagnosis not present

## 2015-03-28 DIAGNOSIS — A630 Anogenital (venereal) warts: Secondary | ICD-10-CM | POA: Diagnosis not present

## 2015-03-28 DIAGNOSIS — Z6841 Body Mass Index (BMI) 40.0 and over, adult: Secondary | ICD-10-CM | POA: Diagnosis not present

## 2015-04-11 DIAGNOSIS — J040 Acute laryngitis: Secondary | ICD-10-CM | POA: Diagnosis not present

## 2015-04-11 DIAGNOSIS — J029 Acute pharyngitis, unspecified: Secondary | ICD-10-CM | POA: Diagnosis not present

## 2015-04-25 DIAGNOSIS — Z713 Dietary counseling and surveillance: Secondary | ICD-10-CM | POA: Diagnosis not present

## 2015-04-25 DIAGNOSIS — Z6841 Body Mass Index (BMI) 40.0 and over, adult: Secondary | ICD-10-CM | POA: Diagnosis not present

## 2015-06-21 DIAGNOSIS — I10 Essential (primary) hypertension: Secondary | ICD-10-CM | POA: Diagnosis not present

## 2015-06-21 DIAGNOSIS — E7800 Pure hypercholesterolemia, unspecified: Secondary | ICD-10-CM | POA: Diagnosis not present

## 2015-06-21 DIAGNOSIS — R7301 Impaired fasting glucose: Secondary | ICD-10-CM | POA: Diagnosis not present

## 2015-06-21 DIAGNOSIS — M109 Gout, unspecified: Secondary | ICD-10-CM | POA: Diagnosis not present

## 2015-06-25 DIAGNOSIS — R7301 Impaired fasting glucose: Secondary | ICD-10-CM | POA: Diagnosis not present

## 2015-06-25 DIAGNOSIS — M109 Gout, unspecified: Secondary | ICD-10-CM | POA: Diagnosis not present

## 2015-06-25 DIAGNOSIS — E785 Hyperlipidemia, unspecified: Secondary | ICD-10-CM | POA: Diagnosis not present

## 2015-06-25 DIAGNOSIS — I10 Essential (primary) hypertension: Secondary | ICD-10-CM | POA: Diagnosis not present

## 2015-06-25 DIAGNOSIS — Z23 Encounter for immunization: Secondary | ICD-10-CM | POA: Diagnosis not present

## 2015-07-03 ENCOUNTER — Other Ambulatory Visit: Payer: Self-pay | Admitting: *Deleted

## 2015-07-03 MED ORDER — RANITIDINE HCL 300 MG PO TABS: 300.0000 mg | ORAL_TABLET | Freq: Every day | ORAL | Status: DC

## 2015-07-11 ENCOUNTER — Ambulatory Visit (INDEPENDENT_AMBULATORY_CARE_PROVIDER_SITE_OTHER): Payer: Medicare Other | Admitting: Allergy and Immunology

## 2015-07-11 ENCOUNTER — Encounter: Payer: Self-pay | Admitting: Allergy and Immunology

## 2015-07-11 VITALS — BP 112/80 | HR 80 | Resp 16

## 2015-07-11 DIAGNOSIS — J3089 Other allergic rhinitis: Secondary | ICD-10-CM

## 2015-07-11 DIAGNOSIS — J387 Other diseases of larynx: Secondary | ICD-10-CM

## 2015-07-11 DIAGNOSIS — K219 Gastro-esophageal reflux disease without esophagitis: Secondary | ICD-10-CM

## 2015-07-11 MED ORDER — RANITIDINE HCL 300 MG PO TABS: ORAL_TABLET | ORAL | Status: DC

## 2015-07-11 MED ORDER — QNASL 80 MCG/ACT NA AERS: INHALATION_SPRAY | NASAL | Status: DC

## 2015-07-11 MED ORDER — DEXLANSOPRAZOLE 60 MG PO CPDR: DELAYED_RELEASE_CAPSULE | ORAL | Status: DC

## 2015-07-11 NOTE — Patient Instructions (Signed)
  1. Continue Dexilant 60 mg in a.m. plus ranitidine 300 mg in PM  2. Continue Qnasl 80 one puff each nostril one time per day  3. Continue antihistamine and Mucinex DM if needed  4. Get fall flu vaccine  5. Return in 12 months or earlier if problem

## 2015-07-11 NOTE — Progress Notes (Signed)
Follow-up Note  Referring Provider: Lujean Amel, MD Primary Provider: Lujean Amel, MD Date of Office Visit: 07/11/2015  Subjective:   Rachel Brewer (DOB: 11/25/1950) is a 65 y.o. female who returns to the [facility] on 07/11/2015 in re-evaluation of the following:  HPI: Rachel Brewer returns to this clinic in evaluation of her allergic rhinitis and LPR. She's done quite well over the course of the past 6 months with very little problem involving her throat or her nose. She is not had any episodes of sinusitis requiring antibiotic. She still occasionally has a little bit of a cough and throat clearing but this is dramatically improved at least 95% while using medical treatment.    Medication List           dexlansoprazole 60 MG capsule  Commonly known as:  DEXILANT  Take 1 capsule (60 mg total) by mouth every morning.     doxazosin 4 MG tablet  Commonly known as:  CARDURA  Take 4 mg by mouth daily.     fexofenadine 180 MG tablet  Commonly known as:  ALLEGRA  Take 180 mg by mouth daily.     FLUTTER Devi  Blow through 4 times per cycle, 3 cycles per day     folic acid 1 MG tablet  Commonly known as:  FOLVITE     ibuprofen 200 MG tablet  Commonly known as:  ADVIL,MOTRIN  Take 600 mg by mouth every 6 (six) hours as needed for headache or mild pain.     ibuprofen 600 MG tablet  Commonly known as:  MOTRIN IB  Take 1 tablet (600 mg total) by mouth every 6 (six) hours as needed.     ketoconazole 2 % cream  Commonly known as:  NIZORAL  Apply 1 application topically daily as needed for irritation.     losartan 50 MG tablet  Commonly known as:  COZAAR  Take 50 mg by mouth daily.     metoprolol succinate 50 MG 24 hr tablet  Commonly known as:  TOPROL-XL  Take 50 mg by mouth daily.     oxyCODONE-acetaminophen 5-325 MG tablet  Commonly known as:  ROXICET  Take 1-2 tablets by mouth every 6 (six) hours as needed for severe pain.     QNASL 80  MCG/ACT Aers  Generic drug:  Beclomethasone Dipropionate  Place 1 puff into the nose daily.     QSYMIA 7.5-46 MG Cp24  Generic drug:  Phentermine-Topiramate     ranitidine 300 MG tablet  Commonly known as:  ZANTAC  Take 1 tablet (300 mg total) by mouth at bedtime.     Vitamin B-12 1000 MCG Subl  Place 1 tablet under the tongue daily.     VITAMIN D PO  Take 2,000 mg by mouth daily.        Past Medical History  Diagnosis Date  . Allergy   . Vitamin D deficiency   . Costal chondritis   . Hypertension   . Intercostal neuralgia   . Pneumonia   . GERD (gastroesophageal reflux disease)     to prevent reflux  . Headache(784.0)     hx migraines, seasonal headache    Past Surgical History  Procedure Laterality Date  . Laparoscopic gastric banding    . Ankle fracture surgery      right - has screws and plate to reconstruct  . Tonsillectomy    . Colonoscopy    . Video bronchoscopy with endobronchial navigation N/A  04/28/2013    Procedure: VIDEO BRONCHOSCOPY WITH ENDOBRONCHIAL NAVIGATION;  Surgeon: Collene Gobble, MD;  Location: [facility];  Service: Thoracic;  Laterality: N/A;  . Dilatation & currettage/hysteroscopy with resectocope N/A 03/12/2015    Procedure: DILATATION & CURETTAGE/HYSTEROSCOPY  with endometrial polypectomy;  Surgeon: Aloha Gell, MD;  Location: [facility];  Service: Gynecology;  Laterality: N/A;  . Colposcopy N/A 03/12/2015    Procedure: COLPOSCOPY With Cervical Biopsies;  Surgeon: Aloha Gell, MD;  Location: [facility];  Service: Gynecology;  Laterality: N/A;    Allergies  Allergen Reactions  . Diflucan [Fluconazole] Hives    All over body  . Doxycycline     "makes me feel terrible" Causes a feeling of a lump in chest  . Norvasc [Amlodipine Besylate] Other (See Comments)    Edema of ankles  . Ultram [Tramadol Hcl] Nausea Only  . Clindamycin Hcl Rash    Has patient had a PCN reaction causing immediate rash, facial/tongue/throat swelling, SOB or lightheadedness  with hypotension: Yes Has patient had a PCN reaction causing severe rash involving mucus membranes or skin necrosis: No Has patient had a PCN reaction that required hospitalization No Has patient had a PCN reaction occurring within the last 10 years: No If all of the above answers are "NO", then may proceed with Cephalosporin use.  . Singulair [Montelukast Sodium] Rash  . Zestril [Lisinopril] Cough    Review of systems negative except as noted in HPI / PMHx or noted below:  Review of Systems  Constitutional: Negative.   HENT: Negative.   Eyes: Negative.   Respiratory: Negative.   Cardiovascular: Negative.   Gastrointestinal: Negative.   Genitourinary: Negative.   Musculoskeletal: Negative.   Skin: Negative.   Neurological: Negative.   Endo/Heme/Allergies: Negative.   Psychiatric/Behavioral: Negative.      Objective:   Filed Vitals:   07/11/15 1015  BP: 112/80  Pulse: 80  Resp: 16          Physical Exam  Constitutional: She is well-developed, well-nourished, and in no distress.  HENT:  Head: Normocephalic.  Right Ear: Tympanic membrane, external ear and ear canal normal.  Left Ear: Tympanic membrane, external ear and ear canal normal.  Nose: Nose normal. No mucosal edema or rhinorrhea.  Mouth/Throat: Uvula is midline, oropharynx is clear and moist and mucous membranes are normal. No oropharyngeal exudate.  Eyes: Conjunctivae are normal.  Neck: Trachea normal. No tracheal tenderness present. No tracheal deviation present. No thyromegaly present.  Cardiovascular: Normal rate, regular rhythm, S1 normal, S2 normal and normal heart sounds.   No murmur heard. Pulmonary/Chest: Breath sounds normal. No stridor. No respiratory distress. She has no wheezes. She has no rales.  Musculoskeletal: She exhibits no edema.  Lymphadenopathy:       Head (right side): No tonsillar adenopathy present.       Head (left side): No tonsillar adenopathy present.    She has no cervical  adenopathy.  Neurological: She is alert. Gait normal.  Skin: No rash noted. She is not diaphoretic. No erythema. Nails show no clubbing.  Psychiatric: Mood and affect normal.    Diagnostics: None    Assessment and Plan:   1. LPRD (laryngopharyngeal reflux disease)   2. Other allergic rhinitis     1. Continue Dexilant 60 mg in a.m. plus ranitidine 300 mg in PM  2. Continue Qnasl 80 one puff each nostril one time per day  3. Continue antihistamine and Mucinex DM if needed  4. Get fall flu vaccine  5. Return in 12 months or earlier if problem  Diary has done very well on her current medical therapy and I see no need for changing this treatment at this point in time. She will continue to use a combination of Dexilant and ranitidine and Qnasl and I will see her back in this clinic in 12 months or earlier if there is a problem.  Allena Katz, MD [place]

## 2015-07-16 DIAGNOSIS — Z6841 Body Mass Index (BMI) 40.0 and over, adult: Secondary | ICD-10-CM | POA: Diagnosis not present

## 2015-07-16 DIAGNOSIS — Z713 Dietary counseling and surveillance: Secondary | ICD-10-CM | POA: Diagnosis not present

## 2015-09-05 DIAGNOSIS — Z86010 Personal history of colonic polyps: Secondary | ICD-10-CM | POA: Diagnosis not present

## 2015-09-05 DIAGNOSIS — K640 First degree hemorrhoids: Secondary | ICD-10-CM | POA: Diagnosis not present

## 2015-09-28 DIAGNOSIS — Z803 Family history of malignant neoplasm of breast: Secondary | ICD-10-CM | POA: Diagnosis not present

## 2015-09-28 DIAGNOSIS — Z1231 Encounter for screening mammogram for malignant neoplasm of breast: Secondary | ICD-10-CM | POA: Diagnosis not present

## 2015-11-02 DIAGNOSIS — H25013 Cortical age-related cataract, bilateral: Secondary | ICD-10-CM | POA: Diagnosis not present

## 2015-11-02 DIAGNOSIS — H5213 Myopia, bilateral: Secondary | ICD-10-CM | POA: Diagnosis not present

## 2015-11-06 DIAGNOSIS — Z713 Dietary counseling and surveillance: Secondary | ICD-10-CM | POA: Diagnosis not present

## 2015-11-06 DIAGNOSIS — Z6841 Body Mass Index (BMI) 40.0 and over, adult: Secondary | ICD-10-CM | POA: Diagnosis not present

## 2015-12-13 DIAGNOSIS — H25042 Posterior subcapsular polar age-related cataract, left eye: Secondary | ICD-10-CM | POA: Diagnosis not present

## 2015-12-13 DIAGNOSIS — H25812 Combined forms of age-related cataract, left eye: Secondary | ICD-10-CM | POA: Diagnosis not present

## 2015-12-13 DIAGNOSIS — H268 Other specified cataract: Secondary | ICD-10-CM | POA: Diagnosis not present

## 2016-01-02 DIAGNOSIS — L821 Other seborrheic keratosis: Secondary | ICD-10-CM | POA: Diagnosis not present

## 2016-01-03 DIAGNOSIS — H25011 Cortical age-related cataract, right eye: Secondary | ICD-10-CM | POA: Diagnosis not present

## 2016-01-03 DIAGNOSIS — H268 Other specified cataract: Secondary | ICD-10-CM | POA: Diagnosis not present

## 2016-01-03 DIAGNOSIS — H25811 Combined forms of age-related cataract, right eye: Secondary | ICD-10-CM | POA: Diagnosis not present

## 2016-01-08 DIAGNOSIS — M109 Gout, unspecified: Secondary | ICD-10-CM | POA: Diagnosis not present

## 2016-01-08 DIAGNOSIS — R7301 Impaired fasting glucose: Secondary | ICD-10-CM | POA: Diagnosis not present

## 2016-01-08 DIAGNOSIS — E7800 Pure hypercholesterolemia, unspecified: Secondary | ICD-10-CM | POA: Diagnosis not present

## 2016-01-08 DIAGNOSIS — I10 Essential (primary) hypertension: Secondary | ICD-10-CM | POA: Diagnosis not present

## 2016-01-08 DIAGNOSIS — E559 Vitamin D deficiency, unspecified: Secondary | ICD-10-CM | POA: Diagnosis not present

## 2016-01-10 DIAGNOSIS — E559 Vitamin D deficiency, unspecified: Secondary | ICD-10-CM | POA: Diagnosis not present

## 2016-01-10 DIAGNOSIS — E7800 Pure hypercholesterolemia, unspecified: Secondary | ICD-10-CM | POA: Diagnosis not present

## 2016-01-10 DIAGNOSIS — I10 Essential (primary) hypertension: Secondary | ICD-10-CM | POA: Diagnosis not present

## 2016-01-10 DIAGNOSIS — M109 Gout, unspecified: Secondary | ICD-10-CM | POA: Diagnosis not present

## 2016-01-10 DIAGNOSIS — R7301 Impaired fasting glucose: Secondary | ICD-10-CM | POA: Diagnosis not present

## 2016-02-19 ENCOUNTER — Ambulatory Visit (INDEPENDENT_AMBULATORY_CARE_PROVIDER_SITE_OTHER): Payer: Medicare Other | Admitting: Allergy and Immunology

## 2016-02-19 ENCOUNTER — Encounter: Payer: Self-pay | Admitting: Allergy and Immunology

## 2016-02-19 ENCOUNTER — Encounter (INDEPENDENT_AMBULATORY_CARE_PROVIDER_SITE_OTHER): Payer: Self-pay

## 2016-02-19 VITALS — BP 134/84 | HR 72 | Resp 16

## 2016-02-19 DIAGNOSIS — K219 Gastro-esophageal reflux disease without esophagitis: Secondary | ICD-10-CM | POA: Diagnosis not present

## 2016-02-19 DIAGNOSIS — J3089 Other allergic rhinitis: Secondary | ICD-10-CM

## 2016-02-19 MED ORDER — IPRATROPIUM BROMIDE 0.06 % NA SOLN: NASAL | 11 refills | Status: DC

## 2016-02-19 NOTE — Patient Instructions (Addendum)
  1. Continue Dexilant 60 mg in a.m. Can add ranitidine 300 mg in PM if needed  2. Continue Qnasl 80 1-2 puff each nostril one time per day  3. Can use nasal ipratropium 0.06% 2 sprays each nostril every 6 hours as needed to dry up nose  4. Continue antihistamine and Mucinex DM if needed  5. Return in 12 months or earlier if problem

## 2016-02-19 NOTE — Progress Notes (Signed)
Follow-up Note  Referring Provider: Lujean Amel, MD Primary Provider: Lujean Amel, MD Date of Office Visit: 02/19/2016  Subjective:   Rachel Brewer (DOB: [birth year]) is a 66 y.o. female who returns to the [facility] on 02/19/2016 in re-evaluation of the following:  HPI: Zyrah returns to this clinic in reevaluation of her allergic rhinitis and LPR. I've not seen her in this clinic since June 2017.  She has really done well regarding her LPR and her chronic cough is almost completely gone. She is now just using Dexilant and is no longer using any ranitidine. She has no issues with her throat.  Her nose is doing relatively well but she still has a runny nose on occasion and it is most days that she has a runny nose and has to use a tissue even in the face of utilizing her Qnasl. Recently she did contract a viral upper respiratory tract infection around Christmas time but this has improved and has almost completely resolved.  Allergies as of 02/19/2016      Reactions   Diflucan [fluconazole] Hives   All over body   Doxycycline    "makes me feel terrible" Causes a feeling of a lump in chest   Norvasc [amlodipine Besylate] Other (See Comments)   Edema of ankles   Ultram [tramadol Hcl] Nausea Only   Clindamycin Hcl Rash   Has patient had a PCN reaction causing immediate rash, facial/tongue/throat swelling, SOB or lightheadedness with hypotension: Yes Has patient had a PCN reaction causing severe rash involving mucus membranes or skin necrosis: No Has patient had a PCN reaction that required hospitalization No Has patient had a PCN reaction occurring within the last 10 years: No If all of the above answers are "NO", then may proceed with Cephalosporin use.   Singulair [montelukast Sodium] Rash   Zestril [lisinopril] Cough      Medication List      dexlansoprazole 60 MG capsule Commonly known as:  DEXILANT TAKE ONE CAPSULE EVERY MORNING   doxazosin  4 MG tablet Commonly known as:  CARDURA Take 4 mg by mouth daily.   fexofenadine 180 MG tablet Commonly known as:  ALLEGRA Take 180 mg by mouth daily.   FLUTTER Devi Blow through 4 times per cycle, 3 cycles per day   folic acid 1 MG tablet Commonly known as:  FOLVITE   ketoconazole 2 % cream Commonly known as:  NIZORAL Apply 1 application topically daily as needed for irritation.   losartan 50 MG tablet Commonly known as:  COZAAR Take 50 mg by mouth daily.   metoprolol succinate 50 MG 24 hr tablet Commonly known as:  TOPROL-XL Take 50 mg by mouth daily.   QNASL 80 MCG/ACT Aers Generic drug:  Beclomethasone Dipropionate USE ONE PUFF IN EACH NOSTRIL ONCE DAILY   QSYMIA 7.5-46 MG Cp24 Generic drug:  Phentermine-Topiramate   Vitamin B-12 1000 MCG Subl Place 1 tablet under the tongue daily.   VITAMIN D PO Take 2,000 mg by mouth daily.       Past Medical History:  Diagnosis Date  . Allergy   . Costal chondritis   . GERD (gastroesophageal reflux disease)    to prevent reflux  . Headache(784.0)    hx migraines, seasonal headache  . Hypertension   . Intercostal neuralgia   . Pneumonia   . Vitamin D deficiency     Past Surgical History:  Procedure Laterality Date  . ANKLE FRACTURE SURGERY  right - has screws and plate to reconstruct  . CATARACT EXTRACTION, BILATERAL    . COLONOSCOPY    . COLPOSCOPY N/A 03/12/2015   Procedure: COLPOSCOPY With Cervical Biopsies;  Surgeon: Aloha Gell, MD;  Location: [facility];  Service: Gynecology;  Laterality: N/A;  . DILATATION & CURRETTAGE/HYSTEROSCOPY WITH RESECTOCOPE N/A 03/12/2015   Procedure: DILATATION & CURETTAGE/HYSTEROSCOPY  with endometrial polypectomy;  Surgeon: Aloha Gell, MD;  Location: [facility];  Service: Gynecology;  Laterality: N/A;  . LAPAROSCOPIC GASTRIC BANDING    . TONSILLECTOMY    . VIDEO BRONCHOSCOPY WITH ENDOBRONCHIAL NAVIGATION N/A 04/28/2013   Procedure: VIDEO BRONCHOSCOPY WITH ENDOBRONCHIAL  NAVIGATION;  Surgeon: Collene Gobble, MD;  Location: [facility];  Service: Thoracic;  Laterality: N/A;    Review of systems negative except as noted in HPI / PMHx or noted below:  Review of Systems  Constitutional: Negative.   HENT: Negative.   Eyes: Negative.   Respiratory: Negative.   Cardiovascular: Negative.   Gastrointestinal: Negative.   Genitourinary: Negative.   Musculoskeletal: Negative.   Skin: Negative.   Neurological: Negative.   Endo/Heme/Allergies: Negative.   Psychiatric/Behavioral: Negative.      Objective:   Vitals:   02/19/16 1753  BP: 134/84  Pulse: 72  Resp: 16          Physical Exam  Constitutional: She is well-developed, well-nourished, and in no distress.  HENT:  Head: Normocephalic.  Right Ear: Tympanic membrane, external ear and ear canal normal.  Left Ear: Tympanic membrane, external ear and ear canal normal.  Nose: Nose normal. No mucosal edema or rhinorrhea.  Mouth/Throat: Uvula is midline, oropharynx is clear and moist and mucous membranes are normal. No oropharyngeal exudate.  Eyes: Conjunctivae are normal.  Neck: Trachea normal. No tracheal tenderness present. No tracheal deviation present. No thyromegaly present.  Cardiovascular: Normal rate, regular rhythm, S1 normal, S2 normal and normal heart sounds.   No murmur heard. Pulmonary/Chest: Breath sounds normal. No stridor. No respiratory distress. She has no wheezes. She has no rales.  Musculoskeletal: She exhibits no edema.  Lymphadenopathy:       Head (right side): No tonsillar adenopathy present.       Head (left side): No tonsillar adenopathy present.    She has no cervical adenopathy.  Neurological: She is alert. Gait normal.  Skin: No rash noted. She is not diaphoretic. No erythema. Nails show no clubbing.  Psychiatric: Mood and affect normal.    Diagnostics: none   Assessment and Plan:   1. Other allergic rhinitis   2. LPRD (laryngopharyngeal reflux disease)     1.  Continue Dexilant 60 mg in a.m. Can add ranitidine 300 mg in PM if needed  2. Continue Qnasl 80 1-2 puff each nostril one time per day  3. Can use nasal ipratropium 0.06% 2 sprays each nostril every 6 hours as needed to dry up nose  4. Continue antihistamine and Mucinex DM if needed  5. Return in 12 months or earlier if problem  Annleigh appears to be doing relatively well and the only medical manipulation made today is to add nasal ipratropium that she can use as needed to dry up her nose. She'll continue to use her proton pump inhibitor to treat her LPR and a nasal steroid to treat her allergic rhinitis. I will see her back in this clinic in 12 months or earlier if there is a problem.  Allena Katz, MD [place]

## 2016-03-04 DIAGNOSIS — L821 Other seborrheic keratosis: Secondary | ICD-10-CM | POA: Diagnosis not present

## 2016-03-17 DIAGNOSIS — Z779 Other contact with and (suspected) exposures hazardous to health: Secondary | ICD-10-CM | POA: Diagnosis not present

## 2016-03-17 DIAGNOSIS — Z713 Dietary counseling and surveillance: Secondary | ICD-10-CM | POA: Diagnosis not present

## 2016-03-17 DIAGNOSIS — R87810 Cervical high risk human papillomavirus (HPV) DNA test positive: Secondary | ICD-10-CM | POA: Diagnosis not present

## 2016-03-17 DIAGNOSIS — Z6841 Body Mass Index (BMI) 40.0 and over, adult: Secondary | ICD-10-CM | POA: Diagnosis not present

## 2016-05-12 DIAGNOSIS — Z713 Dietary counseling and surveillance: Secondary | ICD-10-CM | POA: Diagnosis not present

## 2016-05-12 DIAGNOSIS — Z6841 Body Mass Index (BMI) 40.0 and over, adult: Secondary | ICD-10-CM | POA: Diagnosis not present

## 2016-05-26 DIAGNOSIS — M25562 Pain in left knee: Secondary | ICD-10-CM | POA: Diagnosis not present

## 2016-06-09 DIAGNOSIS — S76312D Strain of muscle, fascia and tendon of the posterior muscle group at thigh level, left thigh, subsequent encounter: Secondary | ICD-10-CM | POA: Diagnosis not present

## 2016-06-11 DIAGNOSIS — S76312D Strain of muscle, fascia and tendon of the posterior muscle group at thigh level, left thigh, subsequent encounter: Secondary | ICD-10-CM | POA: Diagnosis not present

## 2016-06-12 ENCOUNTER — Telehealth: Payer: Self-pay

## 2016-06-12 NOTE — Telephone Encounter (Signed)
Dexilant has been approved from 05/12/16-06/11/2017.

## 2016-06-16 DIAGNOSIS — S76312D Strain of muscle, fascia and tendon of the posterior muscle group at thigh level, left thigh, subsequent encounter: Secondary | ICD-10-CM | POA: Diagnosis not present

## 2016-06-18 DIAGNOSIS — S76312D Strain of muscle, fascia and tendon of the posterior muscle group at thigh level, left thigh, subsequent encounter: Secondary | ICD-10-CM | POA: Diagnosis not present

## 2016-06-23 DIAGNOSIS — S76312D Strain of muscle, fascia and tendon of the posterior muscle group at thigh level, left thigh, subsequent encounter: Secondary | ICD-10-CM | POA: Diagnosis not present

## 2016-06-25 DIAGNOSIS — M25562 Pain in left knee: Secondary | ICD-10-CM | POA: Diagnosis not present

## 2016-06-26 DIAGNOSIS — S76312D Strain of muscle, fascia and tendon of the posterior muscle group at thigh level, left thigh, subsequent encounter: Secondary | ICD-10-CM | POA: Diagnosis not present

## 2016-07-01 DIAGNOSIS — S76312D Strain of muscle, fascia and tendon of the posterior muscle group at thigh level, left thigh, subsequent encounter: Secondary | ICD-10-CM | POA: Diagnosis not present

## 2016-07-02 DIAGNOSIS — S76312D Strain of muscle, fascia and tendon of the posterior muscle group at thigh level, left thigh, subsequent encounter: Secondary | ICD-10-CM | POA: Diagnosis not present

## 2016-07-04 DIAGNOSIS — S76312D Strain of muscle, fascia and tendon of the posterior muscle group at thigh level, left thigh, subsequent encounter: Secondary | ICD-10-CM | POA: Diagnosis not present

## 2016-07-07 DIAGNOSIS — S76312D Strain of muscle, fascia and tendon of the posterior muscle group at thigh level, left thigh, subsequent encounter: Secondary | ICD-10-CM | POA: Diagnosis not present

## 2016-07-08 DIAGNOSIS — E559 Vitamin D deficiency, unspecified: Secondary | ICD-10-CM | POA: Diagnosis not present

## 2016-07-08 DIAGNOSIS — E7800 Pure hypercholesterolemia, unspecified: Secondary | ICD-10-CM | POA: Diagnosis not present

## 2016-07-08 DIAGNOSIS — M109 Gout, unspecified: Secondary | ICD-10-CM | POA: Diagnosis not present

## 2016-07-08 DIAGNOSIS — Z79899 Other long term (current) drug therapy: Secondary | ICD-10-CM | POA: Diagnosis not present

## 2016-07-08 DIAGNOSIS — R7301 Impaired fasting glucose: Secondary | ICD-10-CM | POA: Diagnosis not present

## 2016-07-08 DIAGNOSIS — I10 Essential (primary) hypertension: Secondary | ICD-10-CM | POA: Diagnosis not present

## 2016-07-09 DIAGNOSIS — S76312D Strain of muscle, fascia and tendon of the posterior muscle group at thigh level, left thigh, subsequent encounter: Secondary | ICD-10-CM | POA: Diagnosis not present

## 2016-07-11 DIAGNOSIS — S76312D Strain of muscle, fascia and tendon of the posterior muscle group at thigh level, left thigh, subsequent encounter: Secondary | ICD-10-CM | POA: Diagnosis not present

## 2016-07-14 DIAGNOSIS — E7800 Pure hypercholesterolemia, unspecified: Secondary | ICD-10-CM | POA: Diagnosis not present

## 2016-07-14 DIAGNOSIS — M109 Gout, unspecified: Secondary | ICD-10-CM | POA: Diagnosis not present

## 2016-07-14 DIAGNOSIS — I10 Essential (primary) hypertension: Secondary | ICD-10-CM | POA: Diagnosis not present

## 2016-07-14 DIAGNOSIS — R7301 Impaired fasting glucose: Secondary | ICD-10-CM | POA: Diagnosis not present

## 2016-07-21 DIAGNOSIS — S76312D Strain of muscle, fascia and tendon of the posterior muscle group at thigh level, left thigh, subsequent encounter: Secondary | ICD-10-CM | POA: Diagnosis not present

## 2016-07-23 DIAGNOSIS — S76312D Strain of muscle, fascia and tendon of the posterior muscle group at thigh level, left thigh, subsequent encounter: Secondary | ICD-10-CM | POA: Diagnosis not present

## 2016-07-24 DIAGNOSIS — S76312D Strain of muscle, fascia and tendon of the posterior muscle group at thigh level, left thigh, subsequent encounter: Secondary | ICD-10-CM | POA: Diagnosis not present

## 2016-07-28 DIAGNOSIS — S76312D Strain of muscle, fascia and tendon of the posterior muscle group at thigh level, left thigh, subsequent encounter: Secondary | ICD-10-CM | POA: Diagnosis not present

## 2016-07-30 DIAGNOSIS — S76312D Strain of muscle, fascia and tendon of the posterior muscle group at thigh level, left thigh, subsequent encounter: Secondary | ICD-10-CM | POA: Diagnosis not present

## 2016-08-26 ENCOUNTER — Other Ambulatory Visit: Payer: Self-pay | Admitting: Allergy and Immunology

## 2016-08-26 MED ORDER — DEXLANSOPRAZOLE 60 MG PO CPDR: DELAYED_RELEASE_CAPSULE | ORAL | 3 refills | Status: DC

## 2016-08-26 NOTE — Telephone Encounter (Signed)
Prescription has been sent in to pharmacy. 

## 2016-08-26 NOTE — Telephone Encounter (Signed)
Patient called and said she had requested a prescription about a month ago for [place]. It was suppose to be sent to [facility]. We show a receipt by pharmacy from them, but she said she was told that by [place], but they say they never received it. She said she had a prior authorization form with her, so everything should be correct. She wants to know if someone could check on why they didn't receive it.

## 2016-09-17 DIAGNOSIS — M25561 Pain in right knee: Secondary | ICD-10-CM | POA: Diagnosis not present

## 2016-09-24 DIAGNOSIS — M25561 Pain in right knee: Secondary | ICD-10-CM | POA: Diagnosis not present

## 2016-10-13 DIAGNOSIS — M25561 Pain in right knee: Secondary | ICD-10-CM | POA: Diagnosis not present

## 2016-10-14 DIAGNOSIS — Z1231 Encounter for screening mammogram for malignant neoplasm of breast: Secondary | ICD-10-CM | POA: Diagnosis not present

## 2017-01-06 DIAGNOSIS — E559 Vitamin D deficiency, unspecified: Secondary | ICD-10-CM | POA: Diagnosis not present

## 2017-01-06 DIAGNOSIS — I10 Essential (primary) hypertension: Secondary | ICD-10-CM | POA: Diagnosis not present

## 2017-01-06 DIAGNOSIS — E7800 Pure hypercholesterolemia, unspecified: Secondary | ICD-10-CM | POA: Diagnosis not present

## 2017-01-06 DIAGNOSIS — M109 Gout, unspecified: Secondary | ICD-10-CM | POA: Diagnosis not present

## 2017-01-06 DIAGNOSIS — R7301 Impaired fasting glucose: Secondary | ICD-10-CM | POA: Diagnosis not present

## 2017-01-07 DIAGNOSIS — G588 Other specified mononeuropathies: Secondary | ICD-10-CM | POA: Diagnosis not present

## 2017-01-09 DIAGNOSIS — G580 Intercostal neuropathy: Secondary | ICD-10-CM | POA: Diagnosis not present

## 2017-01-09 DIAGNOSIS — G588 Other specified mononeuropathies: Secondary | ICD-10-CM | POA: Diagnosis not present

## 2017-01-19 DIAGNOSIS — M109 Gout, unspecified: Secondary | ICD-10-CM | POA: Diagnosis not present

## 2017-01-19 DIAGNOSIS — E7800 Pure hypercholesterolemia, unspecified: Secondary | ICD-10-CM | POA: Diagnosis not present

## 2017-01-19 DIAGNOSIS — R7301 Impaired fasting glucose: Secondary | ICD-10-CM | POA: Diagnosis not present

## 2017-01-19 DIAGNOSIS — I10 Essential (primary) hypertension: Secondary | ICD-10-CM | POA: Diagnosis not present

## 2017-01-19 DIAGNOSIS — E559 Vitamin D deficiency, unspecified: Secondary | ICD-10-CM | POA: Diagnosis not present

## 2017-02-05 DIAGNOSIS — G588 Other specified mononeuropathies: Secondary | ICD-10-CM | POA: Diagnosis not present

## 2017-02-05 DIAGNOSIS — Z6841 Body Mass Index (BMI) 40.0 and over, adult: Secondary | ICD-10-CM | POA: Diagnosis not present

## 2017-02-05 DIAGNOSIS — I10 Essential (primary) hypertension: Secondary | ICD-10-CM | POA: Diagnosis not present

## 2017-02-10 DIAGNOSIS — Z961 Presence of intraocular lens: Secondary | ICD-10-CM | POA: Diagnosis not present

## 2017-02-10 DIAGNOSIS — H5213 Myopia, bilateral: Secondary | ICD-10-CM | POA: Diagnosis not present

## 2017-03-06 DIAGNOSIS — G588 Other specified mononeuropathies: Secondary | ICD-10-CM | POA: Diagnosis not present

## 2017-03-06 DIAGNOSIS — G580 Intercostal neuropathy: Secondary | ICD-10-CM | POA: Diagnosis not present

## 2017-03-17 ENCOUNTER — Ambulatory Visit (INDEPENDENT_AMBULATORY_CARE_PROVIDER_SITE_OTHER): Payer: Medicare Other | Admitting: Allergy and Immunology

## 2017-03-17 ENCOUNTER — Encounter: Payer: Self-pay | Admitting: Allergy and Immunology

## 2017-03-17 VITALS — BP 144/92 | HR 80 | Resp 20

## 2017-03-17 DIAGNOSIS — K219 Gastro-esophageal reflux disease without esophagitis: Secondary | ICD-10-CM

## 2017-03-17 DIAGNOSIS — J3089 Other allergic rhinitis: Secondary | ICD-10-CM

## 2017-03-17 MED ORDER — DEXLANSOPRAZOLE 60 MG PO CPDR: DELAYED_RELEASE_CAPSULE | ORAL | 3 refills | Status: DC

## 2017-03-18 ENCOUNTER — Encounter: Payer: Self-pay | Admitting: Allergy and Immunology

## 2017-03-18 NOTE — Progress Notes (Signed)
Follow-up Note  Referring Provider: Lujean Amel, MD Primary Provider: Lujean Amel, MD Date of Office Visit: 03/17/2017  Subjective:   Rachel Brewer (DOB: 25-Aug-1950) is a 67 y.o. female who returns to the [facility] on 03/17/2017 in re-evaluation of the following:  HPI: Rachel Brewer returns to this clinic in reevaluation of allergic rhinitis and LPR.  Her last visit to this clinic was 19 February 2016.  She does not cough and she has had no problems with her nose and no problems with her throat and no problems with reflux.  She continues to use Dexilant and a rare ranitidine and no longer uses a nasal steroid but does continue to use ipratropium nasal spray.  She has not required a systemic steroid or antibiotic to treat any type of respiratory tract issue.  She is received the flu vaccine, Prevnar, Pneumovax, and 2 injections of shingles vaccine.  Allergies as of 03/17/2017      Reactions   Diflucan [fluconazole] Hives   All over body   Doxycycline    "makes me feel terrible" Causes a feeling of a lump in chest   Norvasc [amlodipine Besylate] Other (See Comments)   Edema of ankles   Ultram [tramadol Hcl] Nausea Only   Clindamycin Hcl Rash   Has patient had a PCN reaction causing immediate rash, facial/tongue/throat swelling, SOB or lightheadedness with hypotension: Yes Has patient had a PCN reaction causing severe rash involving mucus membranes or skin necrosis: No Has patient had a PCN reaction that required hospitalization No Has patient had a PCN reaction occurring within the last 10 years: No If all of the above answers are "NO", then may proceed with Cephalosporin use.   Singulair [montelukast Sodium] Rash   Zestril [lisinopril] Cough      Medication List      dexlansoprazole 60 MG capsule Commonly known as:  DEXILANT TAKE ONE CAPSULE EVERY MORNING   fexofenadine 180 MG tablet Commonly known as:  ALLEGRA Take 180 mg by mouth daily.     FLUTTER Devi Blow through 4 times per cycle, 3 cycles per day   folic acid 1 MG tablet Commonly known as:  FOLVITE   ipratropium 0.06 % nasal spray Commonly known as:  ATROVENT Can use two sprays in each nostril every six hours as needed to dry up nose.   metoprolol succinate 50 MG 24 hr tablet Commonly known as:  TOPROL-XL Take 50 mg by mouth daily.   rosuvastatin 5 MG tablet Commonly known as:  CRESTOR   Vitamin B-12 1000 MCG Subl Place 1 tablet under the tongue daily.   VITAMIN D PO Take 2,000 mg by mouth daily.       Past Medical History:  Diagnosis Date  . Allergy   . Costal chondritis   . GERD (gastroesophageal reflux disease)    to prevent reflux  . Headache(784.0)    hx migraines, seasonal headache  . Hypertension   . Intercostal neuralgia   . Pneumonia   . Vitamin D deficiency     Past Surgical History:  Procedure Laterality Date  . ANKLE FRACTURE SURGERY     right - has screws and plate to reconstruct  . CATARACT EXTRACTION, BILATERAL    . COLONOSCOPY    . COLPOSCOPY N/A 03/12/2015   Procedure: COLPOSCOPY With Cervical Biopsies;  Surgeon: Aloha Gell, MD;  Location: [facility];  Service: Gynecology;  Laterality: N/A;  . DILATATION & CURRETTAGE/HYSTEROSCOPY WITH RESECTOCOPE N/A 03/12/2015   Procedure:  DILATATION & CURETTAGE/HYSTEROSCOPY  with endometrial polypectomy;  Surgeon: Aloha Gell, MD;  Location: [facility];  Service: Gynecology;  Laterality: N/A;  . LAPAROSCOPIC GASTRIC BANDING    . TONSILLECTOMY    . VIDEO BRONCHOSCOPY WITH ENDOBRONCHIAL NAVIGATION N/A 04/28/2013   Procedure: VIDEO BRONCHOSCOPY WITH ENDOBRONCHIAL NAVIGATION;  Surgeon: Collene Gobble, MD;  Location: [facility];  Service: Thoracic;  Laterality: N/A;    Review of systems negative except as noted in HPI / PMHx or noted below:  Review of Systems  Constitutional: Negative.   HENT: Negative.   Eyes: Negative.   Respiratory: Negative.   Cardiovascular: Negative.   Gastrointestinal:  Negative.   Genitourinary: Negative.   Musculoskeletal: Negative.   Skin: Negative.   Neurological: Negative.   Endo/Heme/Allergies: Negative.   Psychiatric/Behavioral: Negative.      Objective:   Vitals:   03/17/17 1728  BP: (!) 144/92  Pulse: 80  Resp: 20          Physical Exam  Constitutional: She is well-developed, well-nourished, and in no distress.  HENT:  Head: Normocephalic.  Right Ear: Tympanic membrane, external ear and ear canal normal.  Left Ear: Tympanic membrane, external ear and ear canal normal.  Nose: Nose normal. No mucosal edema or rhinorrhea.  Mouth/Throat: Uvula is midline, oropharynx is clear and moist and mucous membranes are normal. No oropharyngeal exudate.  Eyes: Conjunctivae are normal.  Neck: Trachea normal. No tracheal tenderness present. No tracheal deviation present. No thyromegaly present.  Cardiovascular: Normal rate, regular rhythm, S1 normal, S2 normal and normal heart sounds.  No murmur heard. Pulmonary/Chest: Breath sounds normal. No stridor. No respiratory distress. She has no wheezes. She has no rales.  Musculoskeletal: She exhibits no edema.  Lymphadenopathy:       Head (right side): No tonsillar adenopathy present.       Head (left side): No tonsillar adenopathy present.    She has no cervical adenopathy.  Neurological: She is alert. Gait normal.  Skin: No rash noted. She is not diaphoretic. No erythema. Nails show no clubbing.  Psychiatric: Mood and affect normal.    Diagnostics:    none  Assessment and Plan:   1. Other allergic rhinitis   2. LPRD (laryngopharyngeal reflux disease)     1. Continue Dexilant 60 mg in a.m. Can add ranitidine 300 mg in PM if needed  2. Can use nasal ipratropium 0.06% 2 sprays each nostril every 6 hours as needed to dry up nose  3. Continue antihistamine and Mucinex DM if needed  4. Return in 12 months or earlier if problem  [name] is really doing quite well on her current and I have  refilled her medications and she can follow-up in this clinic in 1 year or earlier if there is a problem.  Allena Katz, MD Allergy / [facility] [place]

## 2017-03-18 NOTE — Patient Instructions (Addendum)
  1. Continue Dexilant 60 mg in a.m. Can add ranitidine 300 mg in PM if needed  2. Can use nasal ipratropium 0.06% 2 sprays each nostril every 6 hours as needed to dry up nose  3. Continue antihistamine and Mucinex DM if needed  4. Return in 12 months or earlier if problem

## 2017-04-01 DIAGNOSIS — G588 Other specified mononeuropathies: Secondary | ICD-10-CM | POA: Diagnosis not present

## 2017-04-17 DIAGNOSIS — G580 Intercostal neuropathy: Secondary | ICD-10-CM | POA: Diagnosis not present

## 2017-04-17 DIAGNOSIS — G588 Other specified mononeuropathies: Secondary | ICD-10-CM | POA: Diagnosis not present

## 2017-05-26 ENCOUNTER — Other Ambulatory Visit: Payer: Self-pay

## 2017-05-26 MED ORDER — IPRATROPIUM BROMIDE 0.06 % NA SOLN: NASAL | 9 refills | Status: DC

## 2017-06-11 DIAGNOSIS — L821 Other seborrheic keratosis: Secondary | ICD-10-CM | POA: Diagnosis not present

## 2017-06-11 DIAGNOSIS — L718 Other rosacea: Secondary | ICD-10-CM | POA: Diagnosis not present

## 2017-06-11 DIAGNOSIS — D2239 Melanocytic nevi of other parts of face: Secondary | ICD-10-CM | POA: Diagnosis not present

## 2017-06-23 DIAGNOSIS — Z6841 Body Mass Index (BMI) 40.0 and over, adult: Secondary | ICD-10-CM | POA: Diagnosis not present

## 2017-06-23 DIAGNOSIS — Z124 Encounter for screening for malignant neoplasm of cervix: Secondary | ICD-10-CM | POA: Diagnosis not present

## 2017-06-23 DIAGNOSIS — I10 Essential (primary) hypertension: Secondary | ICD-10-CM | POA: Diagnosis not present

## 2017-07-03 ENCOUNTER — Telehealth: Payer: Self-pay

## 2017-07-03 NOTE — Telephone Encounter (Signed)
FYI

## 2017-07-03 NOTE — Telephone Encounter (Signed)
Patient is calling because she received a call from [facility] [place] saying they sent our office a letter saying her Dexulant needed a prior authorization. It was sent mid may.

## 2017-07-03 NOTE — Telephone Encounter (Signed)
FYI this is for Dexilant not specialty rx please handle in [place]

## 2017-07-07 NOTE — Telephone Encounter (Signed)
Pa completed

## 2017-07-10 ENCOUNTER — Telehealth: Payer: Self-pay | Admitting: *Deleted

## 2017-07-10 NOTE — Telephone Encounter (Signed)
Patient called inquiring about her Prior Authorization being completed for Dexulant. I told her that it was completed on 07/07/17 and that her pharmacy is aware that it has been completed. I instructed her to call us if she needed any further assistance.

## 2017-07-14 NOTE — Telephone Encounter (Signed)
PA was submitted and then cancelled on covermymeds. I have resubmitted to plan and will await approval/denial.

## 2017-07-14 NOTE — Telephone Encounter (Signed)
Prior Rachel Brewer was approved and is valid through 07-14-2018. Patient is not aware at this time.

## 2017-07-14 NOTE — Telephone Encounter (Signed)
Patient is aware of approval.

## 2017-07-15 DIAGNOSIS — Z780 Asymptomatic menopausal state: Secondary | ICD-10-CM | POA: Diagnosis not present

## 2017-07-17 DIAGNOSIS — I10 Essential (primary) hypertension: Secondary | ICD-10-CM | POA: Diagnosis not present

## 2017-07-17 DIAGNOSIS — E785 Hyperlipidemia, unspecified: Secondary | ICD-10-CM | POA: Diagnosis not present

## 2017-07-17 DIAGNOSIS — E559 Vitamin D deficiency, unspecified: Secondary | ICD-10-CM | POA: Diagnosis not present

## 2017-07-17 DIAGNOSIS — R7301 Impaired fasting glucose: Secondary | ICD-10-CM | POA: Diagnosis not present

## 2017-07-21 DIAGNOSIS — M109 Gout, unspecified: Secondary | ICD-10-CM | POA: Diagnosis not present

## 2017-07-21 DIAGNOSIS — E559 Vitamin D deficiency, unspecified: Secondary | ICD-10-CM | POA: Diagnosis not present

## 2017-07-21 DIAGNOSIS — E7800 Pure hypercholesterolemia, unspecified: Secondary | ICD-10-CM | POA: Diagnosis not present

## 2017-07-21 DIAGNOSIS — I10 Essential (primary) hypertension: Secondary | ICD-10-CM | POA: Diagnosis not present

## 2017-07-21 DIAGNOSIS — R7301 Impaired fasting glucose: Secondary | ICD-10-CM | POA: Diagnosis not present

## 2017-10-07 DIAGNOSIS — M79604 Pain in right leg: Secondary | ICD-10-CM | POA: Diagnosis not present

## 2017-10-07 DIAGNOSIS — R600 Localized edema: Secondary | ICD-10-CM | POA: Diagnosis not present

## 2017-10-07 DIAGNOSIS — M79605 Pain in left leg: Secondary | ICD-10-CM | POA: Diagnosis not present

## 2018-01-14 DIAGNOSIS — Z79899 Other long term (current) drug therapy: Secondary | ICD-10-CM | POA: Diagnosis not present

## 2018-01-14 DIAGNOSIS — E559 Vitamin D deficiency, unspecified: Secondary | ICD-10-CM | POA: Diagnosis not present

## 2018-01-14 DIAGNOSIS — R7301 Impaired fasting glucose: Secondary | ICD-10-CM | POA: Diagnosis not present

## 2018-01-14 DIAGNOSIS — I10 Essential (primary) hypertension: Secondary | ICD-10-CM | POA: Diagnosis not present

## 2018-01-14 DIAGNOSIS — M109 Gout, unspecified: Secondary | ICD-10-CM | POA: Diagnosis not present

## 2018-01-14 DIAGNOSIS — E7800 Pure hypercholesterolemia, unspecified: Secondary | ICD-10-CM | POA: Diagnosis not present

## 2018-01-20 DIAGNOSIS — I10 Essential (primary) hypertension: Secondary | ICD-10-CM | POA: Diagnosis not present

## 2018-01-20 DIAGNOSIS — E7800 Pure hypercholesterolemia, unspecified: Secondary | ICD-10-CM | POA: Diagnosis not present

## 2018-01-20 DIAGNOSIS — R7301 Impaired fasting glucose: Secondary | ICD-10-CM | POA: Diagnosis not present

## 2018-01-20 DIAGNOSIS — M109 Gout, unspecified: Secondary | ICD-10-CM | POA: Diagnosis not present

## 2018-01-20 DIAGNOSIS — E559 Vitamin D deficiency, unspecified: Secondary | ICD-10-CM | POA: Diagnosis not present

## 2018-02-16 DIAGNOSIS — H02055 Trichiasis without entropian left lower eyelid: Secondary | ICD-10-CM | POA: Diagnosis not present

## 2018-02-16 DIAGNOSIS — H5213 Myopia, bilateral: Secondary | ICD-10-CM | POA: Diagnosis not present

## 2018-02-16 DIAGNOSIS — H02052 Trichiasis without entropian right lower eyelid: Secondary | ICD-10-CM | POA: Diagnosis not present

## 2018-02-16 DIAGNOSIS — Z961 Presence of intraocular lens: Secondary | ICD-10-CM | POA: Diagnosis not present

## 2018-02-25 DIAGNOSIS — Z1231 Encounter for screening mammogram for malignant neoplasm of breast: Secondary | ICD-10-CM | POA: Diagnosis not present

## 2018-04-20 ENCOUNTER — Telehealth: Payer: Self-pay | Admitting: Allergy and Immunology

## 2018-04-20 ENCOUNTER — Ambulatory Visit: Payer: Medicare Other | Admitting: Allergy and Immunology

## 2018-04-20 ENCOUNTER — Other Ambulatory Visit: Payer: Self-pay | Admitting: *Deleted

## 2018-04-20 MED ORDER — DEXLANSOPRAZOLE 60 MG PO CPDR: DELAYED_RELEASE_CAPSULE | ORAL | 3 refills | Status: DC

## 2018-04-20 NOTE — Telephone Encounter (Signed)
Called and spoke with patient. Patient stated that she needed refill of Dexilant sent to [facility]. Patient's appointment was moved to next month due to current conditions. Informed patient and refill will be sent in and to please keep appointment. Patient verbalized understanding.

## 2018-04-20 NOTE — Telephone Encounter (Signed)
Pt called and we change here appointment to 06/01/2018. But we need to send in a rx for 90 days to [facility] [phone].

## 2018-06-01 ENCOUNTER — Encounter: Payer: Self-pay | Admitting: Allergy and Immunology

## 2018-06-01 ENCOUNTER — Other Ambulatory Visit: Payer: Self-pay

## 2018-06-01 ENCOUNTER — Ambulatory Visit (INDEPENDENT_AMBULATORY_CARE_PROVIDER_SITE_OTHER): Payer: Medicare Other | Admitting: Allergy and Immunology

## 2018-06-01 DIAGNOSIS — J3089 Other allergic rhinitis: Secondary | ICD-10-CM | POA: Diagnosis not present

## 2018-06-01 DIAGNOSIS — K219 Gastro-esophageal reflux disease without esophagitis: Secondary | ICD-10-CM | POA: Diagnosis not present

## 2018-06-01 MED ORDER — IPRATROPIUM BROMIDE 0.06 % NA SOLN: NASAL | 9 refills | Status: DC

## 2018-06-01 NOTE — Patient Instructions (Addendum)
  1. Continue Dexilant 60 mg in a.m.    2. Can use nasal ipratropium 0.06% 2 sprays each nostril every 6 hours as needed to dry up nose  3. Continue antihistamine and Mucinex DM if needed  4. Return in 12 months or earlier if problem

## 2018-06-01 NOTE — Progress Notes (Signed)
Patient is at home. Provider is in office. Verbal Consent given. Start Time:247 pm End Time: 306 pm

## 2018-06-01 NOTE — Progress Notes (Signed)
[facility]   Follow-up Note  Referring Provider: Lujean Amel, MD Primary Provider: Lujean Amel, MD Date of Office Visit: 06/01/2018  Subjective:   Rachel Brewer (DOB: 10-25-1950) is a 68 y.o. female who returns to the [facility] on 06/01/2018 in re-evaluation of the following:  HPI: This is a E - Med visit requested by patient who is located at home.  Solstice is followed in this clinic for allergic rhinitis and LPR.  Her last visit to this clinic was 17 March 2017.  Reflux good with Dexilant. When laying down to read in bed develops throat clearing cough for a minute or so. Otherwise no other respiratory symptoms.   Very little problem with nose while using nasal ipratropium which works well for runny nose.   Did receive flu vaccine this season.  Dealing with husband's medical problems revolving around surgically extracted glioblastoma the past year.  Allergies as of 06/01/2018      Reactions   Diflucan [fluconazole] Hives   All over body   Doxycycline    "makes me feel terrible" Causes a feeling of a lump in chest   Norvasc [amlodipine Besylate] Other (See Comments)   Edema of ankles   Ultram [tramadol Hcl] Nausea Only   Clindamycin Hcl Rash   Has patient had a PCN reaction causing immediate rash, facial/tongue/throat swelling, SOB or lightheadedness with hypotension: Yes Has patient had a PCN reaction causing severe rash involving mucus membranes or skin necrosis: No Has patient had a PCN reaction that required hospitalization No Has patient had a PCN reaction occurring within the last 10 years: No If all of the above answers are "NO", then may proceed with Cephalosporin use.   Singulair [montelukast Sodium] Rash   Zestril [lisinopril] Cough      Medication List      dexlansoprazole 60 MG capsule Commonly known as:  Dexilant TAKE ONE CAPSULE EVERY MORNING   doxazosin 4 MG tablet  Commonly known as:  CARDURA TK 1 T PO QD   fexofenadine 180 MG tablet Commonly known as:  ALLEGRA Take 180 mg by mouth daily.   Flutter Devi Blow through 4 times per cycle, 3 cycles per day   folic acid 1 MG tablet Commonly known as:  FOLVITE   ipratropium 0.06 % nasal spray Commonly known as:  ATROVENT Can use two sprays in each nostril every six hours as needed to dry up nose.   losartan 50 MG tablet Commonly known as:  COZAAR TK 1 T PO QD   metoprolol succinate 50 MG 24 hr tablet Commonly known as:  TOPROL-XL Take 50 mg by mouth daily.   rosuvastatin 5 MG tablet Commonly known as:  CRESTOR   Vitamin B-12 1000 MCG Subl Place 1 tablet under the tongue daily.   VITAMIN D PO Take 2,000 mg by mouth daily.       Past Medical History:  Diagnosis Date  . Allergy   . Costal chondritis   . GERD (gastroesophageal reflux disease)    to prevent reflux  . Headache(784.0)    hx migraines, seasonal headache  . Hypertension   . Intercostal neuralgia   . Pneumonia   . Vitamin D deficiency     Past Surgical History:  Procedure Laterality Date  . ANKLE FRACTURE SURGERY     right - has screws and plate to reconstruct  . CATARACT EXTRACTION, BILATERAL    . COLONOSCOPY    .  COLPOSCOPY N/A 03/12/2015   Procedure: COLPOSCOPY With Cervical Biopsies;  Surgeon: Aloha Gell, MD;  Location: [facility];  Service: Gynecology;  Laterality: N/A;  . DILATATION & CURRETTAGE/HYSTEROSCOPY WITH RESECTOCOPE N/A 03/12/2015   Procedure: DILATATION & CURETTAGE/HYSTEROSCOPY  with endometrial polypectomy;  Surgeon: Aloha Gell, MD;  Location: [facility];  Service: Gynecology;  Laterality: N/A;  . LAPAROSCOPIC GASTRIC BANDING    . TONSILLECTOMY    . VIDEO BRONCHOSCOPY WITH ENDOBRONCHIAL NAVIGATION N/A 04/28/2013   Procedure: VIDEO BRONCHOSCOPY WITH ENDOBRONCHIAL NAVIGATION;  Surgeon: Collene Gobble, MD;  Location: [facility];  Service: Thoracic;  Laterality: N/A;    Review of systems negative except as  noted in HPI / PMHx or noted below:  Review of Systems  Constitutional: Negative.   HENT: Negative.   Eyes: Negative.   Respiratory: Negative.   Cardiovascular: Negative.   Gastrointestinal: Negative.   Genitourinary: Negative.   Musculoskeletal: Negative.   Skin: Negative.   Neurological: Negative.   Endo/Heme/Allergies: Negative.   Psychiatric/Behavioral: Negative.      Objective:   There were no vitals filed for this visit.        Physical Exam-deferred  Diagnostics: none  Assessment and Plan:   1. Other allergic rhinitis   2. LPRD (laryngopharyngeal reflux disease)     1. Continue Dexilant 60 mg in a.m.    2. Can use nasal ipratropium 0.06% 2 sprays each nostril every 6 hours as needed to dry up nose  3. Continue antihistamine and Mucinex DM if needed  4. Return in 12 months or earlier if problem  Overall, [name] appears to be doing well with her plan of treatment which includes a PPI to address LPR and nasal ipratropium for rhinorrhea.  Assuming she continues to do well with this plan then I will just see her back in this clinic in 1 year or earlier if there is a problem.  Total patient interaction time 15 minutes  Allena Katz, MD Allergy / [place]

## 2018-06-02 ENCOUNTER — Encounter: Payer: Self-pay | Admitting: Allergy and Immunology

## 2018-07-07 DIAGNOSIS — E559 Vitamin D deficiency, unspecified: Secondary | ICD-10-CM | POA: Diagnosis not present

## 2018-07-07 DIAGNOSIS — I10 Essential (primary) hypertension: Secondary | ICD-10-CM | POA: Diagnosis not present

## 2018-07-07 DIAGNOSIS — M109 Gout, unspecified: Secondary | ICD-10-CM | POA: Diagnosis not present

## 2018-07-07 DIAGNOSIS — R7301 Impaired fasting glucose: Secondary | ICD-10-CM | POA: Diagnosis not present

## 2018-07-07 DIAGNOSIS — Z79899 Other long term (current) drug therapy: Secondary | ICD-10-CM | POA: Diagnosis not present

## 2018-07-07 DIAGNOSIS — E7800 Pure hypercholesterolemia, unspecified: Secondary | ICD-10-CM | POA: Diagnosis not present

## 2018-07-14 DIAGNOSIS — E559 Vitamin D deficiency, unspecified: Secondary | ICD-10-CM | POA: Diagnosis not present

## 2018-07-14 DIAGNOSIS — R7301 Impaired fasting glucose: Secondary | ICD-10-CM | POA: Diagnosis not present

## 2018-07-14 DIAGNOSIS — I10 Essential (primary) hypertension: Secondary | ICD-10-CM | POA: Diagnosis not present

## 2018-07-14 DIAGNOSIS — M109 Gout, unspecified: Secondary | ICD-10-CM | POA: Diagnosis not present

## 2018-07-14 DIAGNOSIS — E7800 Pure hypercholesterolemia, unspecified: Secondary | ICD-10-CM | POA: Diagnosis not present

## 2018-08-12 ENCOUNTER — Other Ambulatory Visit: Payer: Self-pay

## 2018-08-12 DIAGNOSIS — I83819 Varicose veins of unspecified lower extremities with pain: Secondary | ICD-10-CM

## 2018-08-13 DIAGNOSIS — Z01419 Encounter for gynecological examination (general) (routine) without abnormal findings: Secondary | ICD-10-CM | POA: Diagnosis not present

## 2018-08-13 DIAGNOSIS — Z124 Encounter for screening for malignant neoplasm of cervix: Secondary | ICD-10-CM | POA: Diagnosis not present

## 2018-08-17 ENCOUNTER — Telehealth (HOSPITAL_COMMUNITY): Payer: Self-pay | Admitting: Rehabilitation

## 2018-08-17 NOTE — Telephone Encounter (Signed)

## 2018-08-18 ENCOUNTER — Ambulatory Visit (HOSPITAL_COMMUNITY)
Admission: RE | Admit: 2018-08-18 | Discharge: 2018-08-18 | Disposition: A | Discharge status: Home / Self Care | Payer: Medicare Other | Source: Ambulatory Visit | Attending: Vascular Surgery | Admitting: Vascular Surgery

## 2018-08-18 ENCOUNTER — Ambulatory Visit (INDEPENDENT_AMBULATORY_CARE_PROVIDER_SITE_OTHER): Payer: Medicare Other | Admitting: Vascular Surgery

## 2018-08-18 ENCOUNTER — Encounter: Payer: Self-pay | Admitting: Vascular Surgery

## 2018-08-18 ENCOUNTER — Other Ambulatory Visit: Payer: Self-pay

## 2018-08-18 VITALS — BP 122/88 | HR 88 | Temp 98.7°F | Resp 18 | Ht 68.5 in | Wt 315.0 lb

## 2018-08-18 DIAGNOSIS — I83813 Varicose veins of bilateral lower extremities with pain: Secondary | ICD-10-CM | POA: Diagnosis not present

## 2018-08-18 DIAGNOSIS — I83819 Varicose veins of unspecified lower extremities with pain: Secondary | ICD-10-CM | POA: Diagnosis not present

## 2018-08-18 NOTE — Progress Notes (Signed)
Referring Physician: Dibas Koirala  Patient name: Rachel Brewer MRN: [id number] DOB: 1950/03/19 Sex: female  REASON FOR CONSULT: Bilateral lower extremity swelling  HPI: Rachel Brewer is a 68 y.o. female, with a several month history of leg swelling and redness around the ankle in both legs.  She stated she began to get more leg swelling taking care of her husband who has had an extended stay in the hospital recently.  She denies any family history of varicose veins.  She has no history of DVT.  She states that elevation of her legs does help.  She has had no prior similar episodes to this.  She states that her left and right legs are fairly equally affected.  She has taken some Aleve which gives her minor relief.  She has also worn some compression stockings intermittently which she stated did not give as much relief because they did not fit well.  Other medical problems include hypertension which is stable.  I also did discuss with her today weight loss is a component to her problem.  Past Medical History:  Diagnosis Date  . Allergy   . Costal chondritis   . GERD (gastroesophageal reflux disease)    to prevent reflux  . Headache(784.0)    hx migraines, seasonal headache  . Hypertension   . Intercostal neuralgia   . Pneumonia   . Vitamin D deficiency    Past Surgical History:  Procedure Laterality Date  . ANKLE FRACTURE SURGERY     right - has screws and plate to reconstruct  . CATARACT EXTRACTION, BILATERAL    . COLONOSCOPY    . COLPOSCOPY N/A 03/12/2015   Procedure: COLPOSCOPY With Cervical Biopsies;  Surgeon: Aloha Gell, MD;  Location: [facility];  Service: Gynecology;  Laterality: N/A;  . DILATATION & CURRETTAGE/HYSTEROSCOPY WITH RESECTOCOPE N/A 03/12/2015   Procedure: DILATATION & CURETTAGE/HYSTEROSCOPY  with endometrial polypectomy;  Surgeon: Aloha Gell, MD;  Location: [facility];  Service: Gynecology;  Laterality: N/A;  . LAPAROSCOPIC GASTRIC BANDING    .  TONSILLECTOMY    . VIDEO BRONCHOSCOPY WITH ENDOBRONCHIAL NAVIGATION N/A 04/28/2013   Procedure: VIDEO BRONCHOSCOPY WITH ENDOBRONCHIAL NAVIGATION;  Surgeon: Collene Gobble, MD;  Location: [facility];  Service: Thoracic;  Laterality: N/A;    Family History  Problem Relation Age of Onset  . Dementia Mother   . Cancer Father        colon  . Heart disease Father   . Allergies Sister   . Allergies Sister     SOCIAL HISTORY: Social History   Socioeconomic History  . Marital status: Married    Spouse name: Not on file  . Number of children: 0  . Years of education: Not on file  . Highest education level: Not on file  Occupational History  . Occupation: Product manager: [employer]  . Financial resource strain: Not on file  . Food insecurity    Worry: Not on file    Inability: Not on file  . Transportation needs    Medical: Not on file    Non-medical: Not on file  Tobacco Use  . Smoking status: Former Smoker    Packs/day: 1.00    Years: 17.00    Pack years: 17.00    Types: Cigarettes    Quit date: 02/04/1983    Years since quitting: 35.5  . Smokeless tobacco: Never Used  Substance and Sexual Activity  . Alcohol use: Yes  Comment: 2 glasses on occasion  . Drug use: No  . Sexual activity: Not on file  Lifestyle  . Physical activity    Days per week: Not on file    Minutes per session: Not on file  . Stress: Not on file  Relationships  . Social Herbalist on phone: Not on file    Gets together: Not on file    Attends religious service: Not on file    Active member of club or organization: Not on file    Attends meetings of clubs or organizations: Not on file    Relationship status: Not on file  . Intimate partner violence    Fear of current or ex partner: Not on file    Emotionally abused: Not on file    Physically abused: Not on file    Forced sexual activity: Not on file  Other Topics Concern  . Not on file  Social History Narrative  .  Not on file    Allergies  Allergen Reactions  . Diflucan [Fluconazole] Hives    All over body  . Doxycycline     "makes me feel terrible" Causes a feeling of a lump in chest  . Norvasc [Amlodipine Besylate] Other (See Comments)    Edema of ankles  . Ultram [Tramadol Hcl] Nausea Only  . Clindamycin Hcl Rash    Has patient had a PCN reaction causing immediate rash, facial/tongue/throat swelling, SOB or lightheadedness with hypotension: Yes Has patient had a PCN reaction causing severe rash involving mucus membranes or skin necrosis: No Has patient had a PCN reaction that required hospitalization No Has patient had a PCN reaction occurring within the last 10 years: No If all of the above answers are "NO", then may proceed with Cephalosporin use.  . Singulair [Montelukast Sodium] Rash  . Zestril [Lisinopril] Cough    Current Outpatient Medications  Medication Sig Dispense Refill  . Cholecalciferol (VITAMIN D PO) Take 2,000 mg by mouth daily.    . Cyanocobalamin (VITAMIN B-12) 1000 MCG SUBL Place 1 tablet under the tongue daily.     Marland Kitchen dexlansoprazole (DEXILANT) 60 MG capsule TAKE ONE CAPSULE EVERY MORNING 90 capsule 3  . doxazosin (CARDURA) 4 MG tablet TK 1 T PO QD    . fexofenadine (ALLEGRA) 180 MG tablet Take 180 mg by mouth daily.    . folic acid (FOLVITE) 1 MG tablet   0  . ipratropium (ATROVENT) 0.06 % nasal spray Can use two sprays in each nostril every six hours as needed to dry up nose. 15 mL 9  . losartan (COZAAR) 50 MG tablet TK 1 T PO QD    . metoprolol (TOPROL-XL) 50 MG 24 hr tablet Take 50 mg by mouth daily.    Marland Kitchen Respiratory Therapy Supplies (FLUTTER) DEVI Blow through 4 times per cycle, 3 cycles per day 1 each 0  . rosuvastatin (CRESTOR) 5 MG tablet      No current facility-administered medications for this visit.     ROS:   General:  No weight loss, Fever, chills  HEENT: No recent headaches, no nasal bleeding, no visual changes, no sore throat  Neurologic: No  dizziness, blackouts, seizures. No recent symptoms of stroke or mini- stroke. No recent episodes of slurred speech, or temporary blindness.  Cardiac: No recent episodes of chest pain/pressure, no shortness of breath at rest.  No shortness of breath with exertion.  Denies history of atrial fibrillation or irregular heartbeat  Vascular: No history  of rest pain in feet.  No history of claudication.  No history of non-healing ulcer, No history of DVT   Pulmonary: No home oxygen, no productive cough, no hemoptysis,  No asthma or wheezing  Musculoskeletal:  [ ]  Arthritis, [ ]  Low back pain,  [ ]  Joint pain  Hematologic:No history of hypercoagulable state.  No history of easy bleeding.  No history of anemia  Gastrointestinal: No hematochezia or melena,  No gastroesophageal reflux, no trouble swallowing  Urinary: [ ]  chronic Kidney disease, [ ]  on HD - [ ]  MWF or [ ]  TTHS, [ ]  Burning with urination, [ ]  Frequent urination, [ ]  Difficulty urinating;   Skin: No rashes  Psychological: No history of anxiety,  No history of depression   Physical Examination Vitals:   08/18/18 1554  BP: 122/88  Pulse: 88  Resp: 18  Temp: 98.7 F (37.1 C)  TempSrc: Temporal  SpO2: 97%  Weight: (!) 315 lb (142.9 kg)  Height: 5' 8.5" (1.74 m)    General:  Alert and oriented, no acute distress HEENT: Normal Neck: No JVD Cardiac: Regular Rate and Rhythm  Skin: No rash, mild erythema gaiter area bilaterally just above the medial malleolus, scattered spider type varicosities diffusely both legs Extremity Pulses:  2+ radial, brachial, femoral, absent dorsalis pedis, 2+ posterior tibial pulses bilaterally Musculoskeletal: No deformity or edema  Neurologic: Upper and lower extremity motor 5/5 and symmetric  DATA:  Patient had a venous reflux exam today.  This showed evidence of diffuse reflux in the left greater saphenous vein with a diameter 4 to 5 mm.  Left femoral junction was competent in the right leg with  only a focal area of reflux near the knee.  She did have some deep vein reflux on the right side.  ASSESSMENT: Symptomatic varicose veins with pain and swelling.  Patient has a dilated incompetent left greater saphenous vein.  On the right side she does not have any evidence of reflux in the saphenofemoral junction.  She also has skin changes and symptoms of varicose veins with gaiter area erythema bilaterally.   PLAN: Patient was refitted today with bilateral lower extremity compression stockings.  We gave her knee-high stockings so that she does not have pinching in the knee crease.  She will use Ace wraps and continued elevation to help with the upper portions of her legs.  The patient will follow-up with [place] in 3 months time to see if she has had improvement.  If not we could consider laser ablation of her left greater saphenous vein.  I discussed with her that weight loss is also a component of management of venous disease and 70% of vein problems improve with weight loss.  She will also consider this.   Ruta Hinds, MD [facility] Office: [phone] Pager: [phone]

## 2018-08-21 ENCOUNTER — Other Ambulatory Visit: Payer: Self-pay | Admitting: Allergy and Immunology

## 2018-09-01 ENCOUNTER — Other Ambulatory Visit: Payer: Self-pay

## 2018-09-01 MED ORDER — IPRATROPIUM BROMIDE 0.06 % NA SOLN: NASAL | 9 refills | Status: DC

## 2018-10-07 ENCOUNTER — Telehealth: Payer: Self-pay | Admitting: *Deleted

## 2018-10-07 NOTE — Telephone Encounter (Signed)
PA submitted for Dexilant via CoverMyMeds and is currently pending.

## 2018-10-08 NOTE — Telephone Encounter (Signed)
Further PA questions have been answered and has been sent via CoverMyMeds.

## 2018-10-08 NOTE — Telephone Encounter (Signed)
PA has been approved for Dexilant. PA approval form has been labeled and placed in bulk scanning.

## 2018-11-17 ENCOUNTER — Encounter: Payer: Self-pay | Admitting: Vascular Surgery

## 2018-11-17 ENCOUNTER — Other Ambulatory Visit: Payer: Self-pay

## 2018-11-17 ENCOUNTER — Ambulatory Visit (INDEPENDENT_AMBULATORY_CARE_PROVIDER_SITE_OTHER): Payer: Medicare Other | Admitting: Vascular Surgery

## 2018-11-17 ENCOUNTER — Ambulatory Visit: Payer: Federal, State, Local not specified - PPO | Admitting: Vascular Surgery

## 2018-11-17 VITALS — BP 145/93 | HR 103 | Resp 16

## 2018-11-17 DIAGNOSIS — I83812 Varicose veins of left lower extremities with pain: Secondary | ICD-10-CM | POA: Diagnosis not present

## 2018-11-17 NOTE — Progress Notes (Signed)
Patient is a 68 year old female who returns for follow-up today.  She was previously seen July 2020.  At that time she had diffuse reflux in the left greater saphenous vein with a diameter of 4 to 5 mm.  We discussed previously weight loss as a component to helping her vein symptoms.  We also discussed the possibility of a laser ablation of her left greater saphenous vein.  At the previous duplex ultrasound she did not have evidence of reflux in the saphenofemoral junction on the right side.  She did have previous history of skin changes with hemosiderin staining of the gaiter area bilaterally.  She has not really been able to lose much weight.  Her husband had a fairly significant seizure in August.  He has become disabled.  She has to help a fair amount around the house with him.  Still describes a burning stinging and irritation near the left medial malleolus.  She does continue to wear her compression stockings.  She wears these as much she can during the day until her legs start to hurt.  Review of systems: She has shortness of breath with exertion.  She has no chest pain.  She has no shortness of breath at rest.  Physical exam:  Vitals:   11/17/18 1357  BP: (!) 145/93  Pulse: (!) 103  Resp: 16  SpO2: 96%   Extremities: 2+ dorsalis pedis pulses  Data: I did a SonoSite ultrasound of the patient today at the bedside.  This shows a dilated left greater saphenous vein essentially 5 mm in uniform diameter from the knee all the way up to the saphenofemoral junction.   Assessment: Symptomatic venous reflux superficial system left leg with pain and skin changes  Plan: Discussed laser ablation risk benefits possible complications and procedure details today.  I discussed the patient that this should improve but would not probably completely alleviate her symptoms without significant weight loss.  She understands and wishes to proceed with venous ablation.  We will work on Sports administrator and contact her regarding this.  Ruta Hinds, MD [facility] Office: [phone] Pager: [phone]

## 2018-11-18 ENCOUNTER — Ambulatory Visit: Payer: Federal, State, Local not specified - PPO | Admitting: Vascular Surgery

## 2019-01-07 ENCOUNTER — Other Ambulatory Visit: Payer: Self-pay | Admitting: Allergy and Immunology

## 2019-03-09 ENCOUNTER — Encounter: Payer: Federal, State, Local not specified - PPO | Admitting: Vascular Surgery

## 2019-03-11 DIAGNOSIS — Z23 Encounter for immunization: Secondary | ICD-10-CM | POA: Diagnosis not present

## 2019-03-29 DIAGNOSIS — H5213 Myopia, bilateral: Secondary | ICD-10-CM | POA: Diagnosis not present

## 2019-03-29 DIAGNOSIS — H26493 Other secondary cataract, bilateral: Secondary | ICD-10-CM | POA: Diagnosis not present

## 2019-03-29 DIAGNOSIS — H02055 Trichiasis without entropian left lower eyelid: Secondary | ICD-10-CM | POA: Diagnosis not present

## 2019-03-29 DIAGNOSIS — H02052 Trichiasis without entropian right lower eyelid: Secondary | ICD-10-CM | POA: Diagnosis not present

## 2019-04-09 DIAGNOSIS — Z23 Encounter for immunization: Secondary | ICD-10-CM | POA: Diagnosis not present

## 2019-04-18 ENCOUNTER — Other Ambulatory Visit: Payer: Self-pay | Admitting: Allergy and Immunology

## 2019-04-27 ENCOUNTER — Other Ambulatory Visit: Payer: Self-pay | Admitting: Allergy and Immunology

## 2019-05-24 DIAGNOSIS — Z1231 Encounter for screening mammogram for malignant neoplasm of breast: Secondary | ICD-10-CM | POA: Diagnosis not present

## 2019-05-26 ENCOUNTER — Other Ambulatory Visit: Payer: Self-pay | Admitting: Allergy and Immunology

## 2019-06-06 DIAGNOSIS — M79672 Pain in left foot: Secondary | ICD-10-CM | POA: Diagnosis not present

## 2019-06-10 DIAGNOSIS — M109 Gout, unspecified: Secondary | ICD-10-CM | POA: Diagnosis not present

## 2019-06-22 ENCOUNTER — Other Ambulatory Visit: Payer: Self-pay | Admitting: Allergy and Immunology

## 2019-06-28 ENCOUNTER — Ambulatory Visit (INDEPENDENT_AMBULATORY_CARE_PROVIDER_SITE_OTHER): Payer: Medicare Other | Admitting: Allergy and Immunology

## 2019-06-28 ENCOUNTER — Other Ambulatory Visit: Payer: Self-pay

## 2019-06-28 ENCOUNTER — Encounter: Payer: Self-pay | Admitting: Allergy and Immunology

## 2019-06-28 VITALS — BP 120/84 | HR 92 | Temp 97.8°F | Resp 18

## 2019-06-28 DIAGNOSIS — J3089 Other allergic rhinitis: Secondary | ICD-10-CM

## 2019-06-28 DIAGNOSIS — K219 Gastro-esophageal reflux disease without esophagitis: Secondary | ICD-10-CM

## 2019-06-28 MED ORDER — DEXILANT 60 MG PO CPDR: DELAYED_RELEASE_CAPSULE | ORAL | 1 refills | Status: DC

## 2019-06-28 MED ORDER — IPRATROPIUM BROMIDE 0.06 % NA SOLN: NASAL | 5 refills | Status: DC

## 2019-06-28 NOTE — Progress Notes (Signed)
[facility] [place]   Follow-up Note  Referring Provider: Lujean Amel, MD Primary Provider: Lujean Amel, MD Date of Office Visit: 06/28/2019  Subjective:   Rachel Brewer (DOB: 1950-03-22) is a 69 y.o. female who returns to the [facility] on 06/28/2019 in re-evaluation of the following:  HPI: Rachel Brewer returns to this clinic in reevaluation of allergic rhinitis and LPR.  Her last contact with this clinic was via a E-med visit on 01 June 2018.  She has really done much better while consistently using her Dexilant regarding her reflux induced coughing.  She still has a little bit of cough on occasion but nothing like it was before starting therapy for reflux.  She has not had any other significant lower airway symptoms.  Her chronic rhinorrhea is under excellent control with the use of nasal ipratropium.  She has received 2 Covid vaccinations.  She has had a bone density scan within the past 2 years which apparently has been normal.  She is going through the grieving process of her husband's passing in January 2021 secondary to glioblastoma.  Allergies as of 06/28/2019      Reactions   Diflucan [fluconazole] Hives   All over body   Doxycycline    "makes me feel terrible" Causes a feeling of a lump in chest   Norvasc [amlodipine Besylate] Other (See Comments)   Edema of ankles   Ultram [tramadol Hcl] Nausea Only   Clindamycin Hcl Rash   Has patient had a PCN reaction causing immediate rash, facial/tongue/throat swelling, SOB or lightheadedness with hypotension: Yes Has patient had a PCN reaction causing severe rash involving mucus membranes or skin necrosis: No Has patient had a PCN reaction that required hospitalization No Has patient had a PCN reaction occurring within the last 10 years: No If all of the above answers are "NO", then may proceed with Cephalosporin use.   Singulair [montelukast Sodium] Rash    Zestril [lisinopril] Cough      Medication List       Accurate as of Jun 28, 2019  5:06 PM. If you have any questions, ask your nurse or doctor.        Dexilant 60 MG capsule Generic drug: dexlansoprazole TAKE 1 CAPSULE EVERY       MORNING   doxazosin 4 MG tablet Commonly known as: CARDURA TK 1 T PO QD   fexofenadine 180 MG tablet Commonly known as: ALLEGRA Take 180 mg by mouth daily.   Flutter Devi Blow through 4 times per cycle, 3 cycles per day   folic acid 1 MG tablet Commonly known as: FOLVITE   ipratropium 0.06 % nasal spray Commonly known as: ATROVENT 2 sprays each nostril every 6hrs as needed to dry up nose   losartan 50 MG tablet Commonly known as: COZAAR TK 1 T PO QD   metoprolol succinate 50 MG 24 hr tablet Commonly known as: TOPROL-XL Take 50 mg by mouth daily.   rosuvastatin 5 MG tablet Commonly known as: CRESTOR   Vitamin B-12 1000 MCG Subl Place 1 tablet under the tongue daily.   VITAMIN D PO Take 3,000 mg by mouth daily.       Past Medical History:  Diagnosis Date  . Allergy   . Costal chondritis   . GERD (gastroesophageal reflux disease)    to prevent reflux  . Headache(784.0)    hx migraines, seasonal headache  . Hypertension   . Intercostal neuralgia   .  Pneumonia   . Vitamin D deficiency     Past Surgical History:  Procedure Laterality Date  . ANKLE FRACTURE SURGERY     right - has screws and plate to reconstruct  . CATARACT EXTRACTION, BILATERAL    . COLONOSCOPY    . COLPOSCOPY N/A 03/12/2015   Procedure: COLPOSCOPY With Cervical Biopsies;  Surgeon: Aloha Gell, MD;  Location: [facility];  Service: Gynecology;  Laterality: N/A;  . DILATATION & CURRETTAGE/HYSTEROSCOPY WITH RESECTOCOPE N/A 03/12/2015   Procedure: DILATATION & CURETTAGE/HYSTEROSCOPY  with endometrial polypectomy;  Surgeon: Aloha Gell, MD;  Location: [facility];  Service: Gynecology;  Laterality: N/A;  . LAPAROSCOPIC GASTRIC BANDING    . TONSILLECTOMY    .  VIDEO BRONCHOSCOPY WITH ENDOBRONCHIAL NAVIGATION N/A 04/28/2013   Procedure: VIDEO BRONCHOSCOPY WITH ENDOBRONCHIAL NAVIGATION;  Surgeon: Collene Gobble, MD;  Location: [facility];  Service: Thoracic;  Laterality: N/A;    Review of systems negative except as noted in HPI / PMHx or noted below:  Review of Systems  Constitutional: Negative.   HENT: Negative.   Eyes: Negative.   Respiratory: Negative.   Cardiovascular: Negative.   Gastrointestinal: Negative.   Genitourinary: Negative.   Musculoskeletal: Negative.   Skin: Negative.   Neurological: Negative.   Endo/Heme/Allergies: Negative.   Psychiatric/Behavioral: Negative.      Objective:   Vitals:   06/28/19 1615  BP: 120/84  Pulse: 92  Resp: 18  Temp: 97.8 F (36.6 C)  SpO2: 95%          Physical Exam Constitutional:      Appearance: She is not diaphoretic.  HENT:     Head: Normocephalic.     Right Ear: Tympanic membrane, ear canal and external ear normal.     Left Ear: Tympanic membrane, ear canal and external ear normal.     Nose: Nose normal. No mucosal edema or rhinorrhea.     Mouth/Throat:     Pharynx: Uvula midline. No oropharyngeal exudate.  Eyes:     Conjunctiva/sclera: Conjunctivae normal.  Neck:     Thyroid: No thyromegaly.     Trachea: Trachea normal. No tracheal tenderness or tracheal deviation.  Cardiovascular:     Rate and Rhythm: Normal rate and regular rhythm.     Heart sounds: Normal heart sounds, S1 normal and S2 normal. No murmur.  Pulmonary:     Effort: No respiratory distress.     Breath sounds: Normal breath sounds. No stridor. No wheezing or rales.  Lymphadenopathy:     Head:     Right side of head: No tonsillar adenopathy.     Left side of head: No tonsillar adenopathy.     Cervical: No cervical adenopathy.  Skin:    Findings: No erythema or rash.     Nails: There is no clubbing.  Neurological:     Mental Status: She is alert.     Diagnostics:   Assessment and Plan:   1. Other  allergic rhinitis   2. LPRD (laryngopharyngeal reflux disease)     1. Continue Dexilant 60 mg 3-7 times per week  2. Can use nasal ipratropium 0.06% 2 sprays each nostril every 6 hours as needed to dry up nose  3. Continue antihistamine and Mucinex DM if needed  4. Return in 12 months or earlier if problem  [name] appears to be doing very well.  There may be an opportunity for her to consolidate some of her medical treatment and she has the option of lowering her dose of  Dexilant to just several times per week.  Assuming she does well with this plan I will see her back in his clinic in 1 year or earlier if there is a problem.  Allena Katz, MD Allergy / Immunology [place]

## 2019-06-28 NOTE — Patient Instructions (Signed)
  1. Continue Dexilant 60 mg 3-7 times per week  2. Can use nasal ipratropium 0.06% 2 sprays each nostril every 6 hours as needed to dry up nose  3. Continue antihistamine and Mucinex DM if needed  4. Return in 12 months or earlier if problem

## 2019-06-29 ENCOUNTER — Encounter: Payer: Self-pay | Admitting: Allergy and Immunology

## 2019-06-29 DIAGNOSIS — M542 Cervicalgia: Secondary | ICD-10-CM | POA: Diagnosis not present

## 2019-07-12 DIAGNOSIS — M542 Cervicalgia: Secondary | ICD-10-CM | POA: Diagnosis not present

## 2019-07-20 DIAGNOSIS — M542 Cervicalgia: Secondary | ICD-10-CM | POA: Diagnosis not present

## 2019-07-25 ENCOUNTER — Other Ambulatory Visit: Payer: Self-pay | Admitting: Allergy and Immunology

## 2019-09-05 DIAGNOSIS — H0015 Chalazion left lower eyelid: Secondary | ICD-10-CM | POA: Diagnosis not present

## 2019-09-19 DIAGNOSIS — L820 Inflamed seborrheic keratosis: Secondary | ICD-10-CM | POA: Diagnosis not present

## 2019-09-19 DIAGNOSIS — D485 Neoplasm of uncertain behavior of skin: Secondary | ICD-10-CM | POA: Diagnosis not present

## 2019-12-20 DIAGNOSIS — L820 Inflamed seborrheic keratosis: Secondary | ICD-10-CM | POA: Diagnosis not present

## 2020-04-03 DIAGNOSIS — Z961 Presence of intraocular lens: Secondary | ICD-10-CM | POA: Diagnosis not present

## 2020-04-03 DIAGNOSIS — H5211 Myopia, right eye: Secondary | ICD-10-CM | POA: Diagnosis not present

## 2020-04-03 DIAGNOSIS — H524 Presbyopia: Secondary | ICD-10-CM | POA: Diagnosis not present

## 2020-04-12 DIAGNOSIS — L820 Inflamed seborrheic keratosis: Secondary | ICD-10-CM | POA: Diagnosis not present

## 2020-04-12 DIAGNOSIS — L821 Other seborrheic keratosis: Secondary | ICD-10-CM | POA: Diagnosis not present

## 2020-05-08 DIAGNOSIS — E559 Vitamin D deficiency, unspecified: Secondary | ICD-10-CM | POA: Diagnosis not present

## 2020-05-08 DIAGNOSIS — M109 Gout, unspecified: Secondary | ICD-10-CM | POA: Diagnosis not present

## 2020-05-08 DIAGNOSIS — R7309 Other abnormal glucose: Secondary | ICD-10-CM | POA: Diagnosis not present

## 2020-05-08 DIAGNOSIS — Z79899 Other long term (current) drug therapy: Secondary | ICD-10-CM | POA: Diagnosis not present

## 2020-05-08 DIAGNOSIS — E7800 Pure hypercholesterolemia, unspecified: Secondary | ICD-10-CM | POA: Diagnosis not present

## 2020-05-08 DIAGNOSIS — I10 Essential (primary) hypertension: Secondary | ICD-10-CM | POA: Diagnosis not present

## 2020-05-10 DIAGNOSIS — Z79899 Other long term (current) drug therapy: Secondary | ICD-10-CM | POA: Diagnosis not present

## 2020-05-10 DIAGNOSIS — I10 Essential (primary) hypertension: Secondary | ICD-10-CM | POA: Diagnosis not present

## 2020-05-10 DIAGNOSIS — M109 Gout, unspecified: Secondary | ICD-10-CM | POA: Diagnosis not present

## 2020-05-10 DIAGNOSIS — R7309 Other abnormal glucose: Secondary | ICD-10-CM | POA: Diagnosis not present

## 2020-05-10 DIAGNOSIS — E7800 Pure hypercholesterolemia, unspecified: Secondary | ICD-10-CM | POA: Diagnosis not present

## 2020-05-10 DIAGNOSIS — E559 Vitamin D deficiency, unspecified: Secondary | ICD-10-CM | POA: Diagnosis not present

## 2020-05-15 DIAGNOSIS — M25511 Pain in right shoulder: Secondary | ICD-10-CM | POA: Diagnosis not present

## 2020-05-17 ENCOUNTER — Other Ambulatory Visit: Payer: Self-pay | Admitting: Allergy and Immunology

## 2020-06-12 DIAGNOSIS — M542 Cervicalgia: Secondary | ICD-10-CM | POA: Diagnosis not present

## 2020-06-18 DIAGNOSIS — M542 Cervicalgia: Secondary | ICD-10-CM | POA: Diagnosis not present

## 2020-06-18 DIAGNOSIS — S43421A Sprain of right rotator cuff capsule, initial encounter: Secondary | ICD-10-CM | POA: Diagnosis not present

## 2020-06-20 DIAGNOSIS — L820 Inflamed seborrheic keratosis: Secondary | ICD-10-CM | POA: Diagnosis not present

## 2020-06-22 DIAGNOSIS — M542 Cervicalgia: Secondary | ICD-10-CM | POA: Diagnosis not present

## 2020-06-28 DIAGNOSIS — M542 Cervicalgia: Secondary | ICD-10-CM | POA: Diagnosis not present

## 2020-07-04 DIAGNOSIS — M542 Cervicalgia: Secondary | ICD-10-CM | POA: Diagnosis not present

## 2020-07-09 DIAGNOSIS — M542 Cervicalgia: Secondary | ICD-10-CM | POA: Diagnosis not present

## 2020-07-17 DIAGNOSIS — M542 Cervicalgia: Secondary | ICD-10-CM | POA: Diagnosis not present

## 2020-08-07 DIAGNOSIS — M542 Cervicalgia: Secondary | ICD-10-CM | POA: Diagnosis not present

## 2020-08-08 DIAGNOSIS — I10 Essential (primary) hypertension: Secondary | ICD-10-CM | POA: Diagnosis not present

## 2020-08-09 DIAGNOSIS — Z1231 Encounter for screening mammogram for malignant neoplasm of breast: Secondary | ICD-10-CM | POA: Diagnosis not present

## 2020-10-29 DIAGNOSIS — H5211 Myopia, right eye: Secondary | ICD-10-CM | POA: Diagnosis not present

## 2020-10-29 DIAGNOSIS — H26493 Other secondary cataract, bilateral: Secondary | ICD-10-CM | POA: Diagnosis not present

## 2020-11-05 DIAGNOSIS — E7800 Pure hypercholesterolemia, unspecified: Secondary | ICD-10-CM | POA: Diagnosis not present

## 2020-11-05 DIAGNOSIS — R7309 Other abnormal glucose: Secondary | ICD-10-CM | POA: Diagnosis not present

## 2020-11-05 DIAGNOSIS — I10 Essential (primary) hypertension: Secondary | ICD-10-CM | POA: Diagnosis not present

## 2020-11-05 DIAGNOSIS — M109 Gout, unspecified: Secondary | ICD-10-CM | POA: Diagnosis not present

## 2020-11-05 DIAGNOSIS — Z79899 Other long term (current) drug therapy: Secondary | ICD-10-CM | POA: Diagnosis not present

## 2020-11-08 DIAGNOSIS — E7800 Pure hypercholesterolemia, unspecified: Secondary | ICD-10-CM | POA: Diagnosis not present

## 2020-11-08 DIAGNOSIS — M109 Gout, unspecified: Secondary | ICD-10-CM | POA: Diagnosis not present

## 2020-11-08 DIAGNOSIS — I10 Essential (primary) hypertension: Secondary | ICD-10-CM | POA: Diagnosis not present

## 2020-11-08 DIAGNOSIS — R7309 Other abnormal glucose: Secondary | ICD-10-CM | POA: Diagnosis not present

## 2020-11-08 DIAGNOSIS — E559 Vitamin D deficiency, unspecified: Secondary | ICD-10-CM | POA: Diagnosis not present

## 2020-11-14 DIAGNOSIS — H26492 Other secondary cataract, left eye: Secondary | ICD-10-CM | POA: Diagnosis not present

## 2020-11-21 DIAGNOSIS — H26491 Other secondary cataract, right eye: Secondary | ICD-10-CM | POA: Diagnosis not present

## 2021-01-15 DIAGNOSIS — D122 Benign neoplasm of ascending colon: Secondary | ICD-10-CM | POA: Diagnosis not present

## 2021-01-15 DIAGNOSIS — Z86010 Personal history of colonic polyps: Secondary | ICD-10-CM | POA: Diagnosis not present

## 2021-01-15 DIAGNOSIS — Z800 Family history of malignant neoplasm of digestive organs: Secondary | ICD-10-CM | POA: Diagnosis not present

## 2021-01-15 DIAGNOSIS — K5730 Diverticulosis of large intestine without perforation or abscess without bleeding: Secondary | ICD-10-CM | POA: Diagnosis not present

## 2021-01-19 ENCOUNTER — Other Ambulatory Visit: Payer: Self-pay | Admitting: Allergy and Immunology

## 2021-01-21 DIAGNOSIS — D122 Benign neoplasm of ascending colon: Secondary | ICD-10-CM | POA: Diagnosis not present

## 2021-04-04 DIAGNOSIS — S92512A Displaced fracture of proximal phalanx of left lesser toe(s), initial encounter for closed fracture: Secondary | ICD-10-CM | POA: Diagnosis not present

## 2021-04-04 DIAGNOSIS — M79675 Pain in left toe(s): Secondary | ICD-10-CM | POA: Diagnosis not present

## 2021-04-11 DIAGNOSIS — Z9889 Other specified postprocedural states: Secondary | ICD-10-CM | POA: Diagnosis not present

## 2021-04-11 DIAGNOSIS — M79675 Pain in left toe(s): Secondary | ICD-10-CM | POA: Diagnosis not present

## 2021-04-25 DIAGNOSIS — M79675 Pain in left toe(s): Secondary | ICD-10-CM | POA: Diagnosis not present

## 2021-04-25 DIAGNOSIS — Z9889 Other specified postprocedural states: Secondary | ICD-10-CM | POA: Diagnosis not present

## 2021-05-07 DIAGNOSIS — L820 Inflamed seborrheic keratosis: Secondary | ICD-10-CM | POA: Diagnosis not present

## 2021-05-07 DIAGNOSIS — L720 Epidermal cyst: Secondary | ICD-10-CM | POA: Diagnosis not present

## 2021-05-07 DIAGNOSIS — L821 Other seborrheic keratosis: Secondary | ICD-10-CM | POA: Diagnosis not present

## 2021-05-07 DIAGNOSIS — L718 Other rosacea: Secondary | ICD-10-CM | POA: Diagnosis not present

## 2021-05-14 DIAGNOSIS — Z9889 Other specified postprocedural states: Secondary | ICD-10-CM | POA: Diagnosis not present

## 2021-05-14 DIAGNOSIS — M79672 Pain in left foot: Secondary | ICD-10-CM | POA: Diagnosis not present

## 2021-05-22 DIAGNOSIS — M109 Gout, unspecified: Secondary | ICD-10-CM | POA: Diagnosis not present

## 2021-05-22 DIAGNOSIS — Z79899 Other long term (current) drug therapy: Secondary | ICD-10-CM | POA: Diagnosis not present

## 2021-05-22 DIAGNOSIS — R7309 Other abnormal glucose: Secondary | ICD-10-CM | POA: Diagnosis not present

## 2021-05-22 DIAGNOSIS — E7800 Pure hypercholesterolemia, unspecified: Secondary | ICD-10-CM | POA: Diagnosis not present

## 2021-05-22 DIAGNOSIS — E559 Vitamin D deficiency, unspecified: Secondary | ICD-10-CM | POA: Diagnosis not present

## 2021-05-27 DIAGNOSIS — I10 Essential (primary) hypertension: Secondary | ICD-10-CM | POA: Diagnosis not present

## 2021-05-27 DIAGNOSIS — Z79899 Other long term (current) drug therapy: Secondary | ICD-10-CM | POA: Diagnosis not present

## 2021-05-27 DIAGNOSIS — R7301 Impaired fasting glucose: Secondary | ICD-10-CM | POA: Diagnosis not present

## 2021-05-27 DIAGNOSIS — E559 Vitamin D deficiency, unspecified: Secondary | ICD-10-CM | POA: Diagnosis not present

## 2021-05-27 DIAGNOSIS — E7800 Pure hypercholesterolemia, unspecified: Secondary | ICD-10-CM | POA: Diagnosis not present

## 2021-05-27 DIAGNOSIS — M109 Gout, unspecified: Secondary | ICD-10-CM | POA: Diagnosis not present

## 2021-05-27 DIAGNOSIS — Z0000 Encounter for general adult medical examination without abnormal findings: Secondary | ICD-10-CM | POA: Diagnosis not present

## 2021-06-19 ENCOUNTER — Ambulatory Visit (INDEPENDENT_AMBULATORY_CARE_PROVIDER_SITE_OTHER): Payer: Medicare Other | Admitting: Podiatry

## 2021-06-19 ENCOUNTER — Encounter: Payer: Self-pay | Admitting: Podiatry

## 2021-06-19 DIAGNOSIS — Q828 Other specified congenital malformations of skin: Secondary | ICD-10-CM | POA: Diagnosis not present

## 2021-06-19 NOTE — Progress Notes (Signed)
?  Subjective:  ?Patient ID: Rachel Brewer, female    DOB: [birth year],   MRN: [id number] ? ?No chief complaint on file. ? ? ?71 y.o. female presents for concern of left foot callus that has been present for several months. Hurts when walking. Has a history of foot surgery and has a history of gout in the feet.  . Denies any other pedal complaints. Denies n/v/f/c.  ? ?Past Medical History:  ?Diagnosis Date  ? Allergy   ? Costal chondritis   ? GERD (gastroesophageal reflux disease)   ? to prevent reflux  ? Headache(784.0)   ? hx migraines, seasonal headache  ? Hypertension   ? Intercostal neuralgia   ? Pneumonia   ? Vitamin D deficiency   ? ? ?Objective:  ?Physical Exam: ?Vascular: DP/PT pulses 2/4 bilateral. CFT <3 seconds. Normal hair growth on digits. No edema.  ?Skin. No lacerations or abrasions bilateral feet. Cored hyperkeratotic lesion noted sub fifth metatarsal on left.  ?Musculoskeletal: MMT 5/5 bilateral lower extremities in DF, PF, Inversion and Eversion. Deceased ROM in DF of ankle joint.  ?Neurological: Sensation intact to light touch.  ? ?Assessment:  ? ?1. Porokeratosis   ? ? ? ?Plan:  ?Patient was evaluated and treated and all questions answered. ?-Discussed corns and calluses with patient and treatment options.  ?-Hyperkeratotic tissue was debrided with chisel without incident as courtesy.  ?-Applied salycylic acid treatment to area with dressing. Advised to remove bandaging tomorrow.  ?-Encouraged daily moisturizing ?-Discussed use of pumice stone ?-Advised good supportive shoes and inserts ?-Patient to return to office as needed or sooner if condition worsens. ? ? ?Lorenda Peck, DPM  ? ? ?

## 2021-09-10 ENCOUNTER — Ambulatory Visit (INDEPENDENT_AMBULATORY_CARE_PROVIDER_SITE_OTHER): Payer: Medicare Other | Admitting: Podiatry

## 2021-09-10 ENCOUNTER — Encounter: Payer: Self-pay | Admitting: Podiatry

## 2021-09-10 DIAGNOSIS — Q828 Other specified congenital malformations of skin: Secondary | ICD-10-CM

## 2021-09-10 DIAGNOSIS — M216X2 Other acquired deformities of left foot: Secondary | ICD-10-CM | POA: Diagnosis not present

## 2021-09-10 NOTE — Progress Notes (Signed)
  Subjective:  Patient ID: Rachel Brewer, female    DOB: 1950-12-12,   MRN: [id number]  Chief Complaint  Patient presents with   Callouses     Left foot callus trim - patient wants to see if she can do anything to remove the sweat gland ( left foot )    71 y.o. female presents for concern of left foot callus that has been present for several months. Hurts when walking. Relates it started getting painful again about 6 months ago.  . Denies any other pedal complaints. Denies n/v/f/c.   Past Medical History:  Diagnosis Date   Allergy    Costal chondritis    GERD (gastroesophageal reflux disease)    to prevent reflux   Headache(784.0)    hx migraines, seasonal headache   Hypertension    Intercostal neuralgia    Pneumonia    Vitamin D deficiency     Objective:  Physical Exam: Vascular: DP/PT pulses 2/4 bilateral. CFT <3 seconds. Normal hair growth on digits. No edema.  Skin. No lacerations or abrasions bilateral feet. Cored hyperkeratotic lesion noted sub fifth metatarsal on left.  Musculoskeletal: MMT 5/5 bilateral lower extremities in DF, PF, Inversion and Eversion. Deceased ROM in DF of ankle joint.  Neurological: Sensation intact to light touch.   Assessment:   1. Porokeratosis   2. 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 -Discussed corns and calluses with patient and treatment options.  -Hyperkeratotic tissue was debrided with chisel without incident as courtesy.  -Applied salycylic acid treatment to area with dressing. Advised to remove bandaging tomorrow.  -Encouraged daily moisturizing -Discussed use of pumice stone -Advised good supportive shoes and inserts -Patient to return to office in 6 weeks and discussed punch biopsy to remove the porokeratosis and see if this resolves the pain.    Lorenda Peck, DPM

## 2021-09-18 ENCOUNTER — Ambulatory Visit: Payer: Medicare Other | Admitting: Podiatry

## 2021-09-26 DIAGNOSIS — Z1231 Encounter for screening mammogram for malignant neoplasm of breast: Secondary | ICD-10-CM | POA: Diagnosis not present

## 2021-10-22 ENCOUNTER — Ambulatory Visit: Payer: Medicare Other | Admitting: Podiatry

## 2021-10-31 ENCOUNTER — Ambulatory Visit: Payer: Medicare Other | Admitting: Podiatry

## 2021-11-14 ENCOUNTER — Ambulatory Visit: Payer: Medicare Other | Admitting: Podiatrist

## 2021-11-22 DIAGNOSIS — R7301 Impaired fasting glucose: Secondary | ICD-10-CM | POA: Diagnosis not present

## 2021-11-22 DIAGNOSIS — M109 Gout, unspecified: Secondary | ICD-10-CM | POA: Diagnosis not present

## 2021-11-22 DIAGNOSIS — Z79899 Other long term (current) drug therapy: Secondary | ICD-10-CM | POA: Diagnosis not present

## 2021-11-22 DIAGNOSIS — E559 Vitamin D deficiency, unspecified: Secondary | ICD-10-CM | POA: Diagnosis not present

## 2021-11-22 DIAGNOSIS — E7800 Pure hypercholesterolemia, unspecified: Secondary | ICD-10-CM | POA: Diagnosis not present

## 2021-11-27 DIAGNOSIS — I10 Essential (primary) hypertension: Secondary | ICD-10-CM | POA: Diagnosis not present

## 2021-11-27 DIAGNOSIS — R7301 Impaired fasting glucose: Secondary | ICD-10-CM | POA: Diagnosis not present

## 2021-11-27 DIAGNOSIS — E7800 Pure hypercholesterolemia, unspecified: Secondary | ICD-10-CM | POA: Diagnosis not present

## 2021-12-02 DIAGNOSIS — H5213 Myopia, bilateral: Secondary | ICD-10-CM | POA: Diagnosis not present

## 2021-12-02 DIAGNOSIS — Z961 Presence of intraocular lens: Secondary | ICD-10-CM | POA: Diagnosis not present

## 2021-12-02 DIAGNOSIS — D23112 Other benign neoplasm of skin of right lower eyelid, including canthus: Secondary | ICD-10-CM | POA: Diagnosis not present

## 2021-12-23 DIAGNOSIS — D23112 Other benign neoplasm of skin of right lower eyelid, including canthus: Secondary | ICD-10-CM | POA: Diagnosis not present

## 2022-01-01 DIAGNOSIS — L718 Other rosacea: Secondary | ICD-10-CM | POA: Diagnosis not present

## 2022-01-01 DIAGNOSIS — L821 Other seborrheic keratosis: Secondary | ICD-10-CM | POA: Diagnosis not present

## 2022-02-11 ENCOUNTER — Ambulatory Visit: Payer: Medicare Other | Admitting: Podiatry

## 2022-02-18 ENCOUNTER — Ambulatory Visit (INDEPENDENT_AMBULATORY_CARE_PROVIDER_SITE_OTHER): Payer: Medicare Other

## 2022-02-18 ENCOUNTER — Encounter: Payer: Self-pay | Admitting: Podiatry

## 2022-02-18 ENCOUNTER — Ambulatory Visit (INDEPENDENT_AMBULATORY_CARE_PROVIDER_SITE_OTHER): Payer: Medicare Other | Admitting: Podiatry

## 2022-02-18 DIAGNOSIS — M216X2 Other acquired deformities of left foot: Secondary | ICD-10-CM

## 2022-02-18 DIAGNOSIS — Q828 Other specified congenital malformations of skin: Secondary | ICD-10-CM

## 2022-02-18 DIAGNOSIS — M2042 Other hammer toe(s) (acquired), left foot: Secondary | ICD-10-CM | POA: Diagnosis not present

## 2022-02-18 DIAGNOSIS — M2041 Other hammer toe(s) (acquired), right foot: Secondary | ICD-10-CM

## 2022-02-18 DIAGNOSIS — M216X1 Other acquired deformities of right foot: Secondary | ICD-10-CM | POA: Diagnosis not present

## 2022-02-18 NOTE — Progress Notes (Signed)
  Subjective:  Patient ID: Rachel Brewer, female    DOB: 05/14/1950,   MRN: [id number]  Chief Complaint  Patient presents with   Callouses    Patient is here for bilateral corns and callous.    72 y.o. female presents for  new concern of right foot callus as well as pain in bilateral toes. She does have a history of gout that mostly has presented in the second left toe. Relates she finds it difficult to find shoes She has pain in the toes and calluses on the end that are troubles some. . Denies any other pedal complaints. Denies n/v/f/c.   Past Medical History:  Diagnosis Date   Allergy    Costal chondritis    GERD (gastroesophageal reflux disease)    to prevent reflux   Headache(784.0)    hx migraines, seasonal headache   Hypertension    Intercostal neuralgia    Pneumonia    Vitamin D deficiency     Objective:  Physical Exam: Vascular: DP/PT pulses 2/4 bilateral. CFT <3 seconds. Normal hair growth on digits. No edema.  Skin. No lacerations or abrasions bilateral feet. Cored hyperkeratotic lesion noted sub fifth metatarsal on left.  Musculoskeletal: MMT 5/5 bilateral lower extremities in DF, PF, Inversion and Eversion. Deceased ROM in DF of ankle joint. Bilateral second and third digits hammered. There is some edema in second left and some tophi present on right third digit.  Neurological: Sensation intact to light touch.   Assessment:   1. Hammer toes of both feet   2. 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 Porokeratosis      Plan:  Patient was evaluated and treated and all questions answered. -Discussed corns and calluses with patient and treatment options.  -Hyperkeratotic tissue was debrided with chisel without incident as courtesy.  -Applied salycylic acid treatment to area with dressing. Advised to remove bandaging tomorrow.  -Encouraged daily moisturizing -Discussed use of pumice stone -Advised good supportive shoes and inserts -X-rays reviewed.  Bilateral hammered second and third digits.  -Educated on hammertoes and treatment options  -Discussed padding including toe caps and crest pads.  -Discussed need for potential surgery if pain does not improved. Discussed surgery in detail and post-operative recovery and needs for pins for 4 weeks.   --Informed surgical risk consent was reviewed and read aloud to the patient.  I reviewed the films.  I have discussed my findings with the patient in great detail.  I have discussed all risks including but not limited to infection, stiffness, scarring, limp, disability, deformity, damage to blood vessels and nerves, numbness, poor healing, need for braces, arthritis, chronic pain, amputation, death.  All benefits and realistic expectations discussed in great detail.  I have made no promises as to the outcome.  I have provided realistic expectations.  I have offered the patient a 2nd opinion, which they have declined and assured me they preferred to proceed despite the risks. -Plan for somtime in march Left second and third hammertoe repair with pinning.     Lorenda Peck, DPM

## 2022-04-01 DIAGNOSIS — L438 Other lichen planus: Secondary | ICD-10-CM | POA: Diagnosis not present

## 2022-04-01 DIAGNOSIS — L718 Other rosacea: Secondary | ICD-10-CM | POA: Diagnosis not present

## 2022-04-01 DIAGNOSIS — L820 Inflamed seborrheic keratosis: Secondary | ICD-10-CM | POA: Diagnosis not present

## 2022-04-01 DIAGNOSIS — L821 Other seborrheic keratosis: Secondary | ICD-10-CM | POA: Diagnosis not present

## 2022-04-01 DIAGNOSIS — L218 Other seborrheic dermatitis: Secondary | ICD-10-CM | POA: Diagnosis not present

## 2022-05-13 ENCOUNTER — Encounter: Payer: Self-pay | Admitting: Podiatry

## 2022-05-13 ENCOUNTER — Ambulatory Visit (INDEPENDENT_AMBULATORY_CARE_PROVIDER_SITE_OTHER): Payer: Medicare Other | Admitting: Podiatry

## 2022-05-13 DIAGNOSIS — M216X2 Other acquired deformities of left foot: Secondary | ICD-10-CM | POA: Diagnosis not present

## 2022-05-13 DIAGNOSIS — Q828 Other specified congenital malformations of skin: Secondary | ICD-10-CM | POA: Diagnosis not present

## 2022-05-13 NOTE — Progress Notes (Signed)
  Subjective:  Patient ID: Rachel Brewer, female    DOB: 01-22-1951,   MRN: [id number]  No chief complaint on file.   72 y.o. female presents for  new concern of right foot callus as well as pain in bilateral toes. Relates she decided against surgery as concern for being at home alone. She does have a history of gout that mostly has presented in the second left toe. Relates she finds it difficult to find shoes . Denies any other pedal complaints. Denies n/v/f/c.   Past Medical History:  Diagnosis Date   Allergy    Costal chondritis    GERD (gastroesophageal reflux disease)    to prevent reflux   Headache(784.0)    hx migraines, seasonal headache   Hypertension    Intercostal neuralgia    Pneumonia    Vitamin D deficiency     Objective:  Physical Exam: Vascular: DP/PT pulses 2/4 bilateral. CFT <3 seconds. Normal hair growth on digits. No edema.  Skin. No lacerations or abrasions bilateral feet. Hyperkeratotic area noted plantar fifth metatarsal head on right.  Musculoskeletal: MMT 5/5 bilateral lower extremities in DF, PF, Inversion and Eversion. Deceased ROM in DF of ankle joint. Bilateral second and third digits hammered. There is some edema in second left and some tophi present on right third digit.  Neurological: Sensation intact to light touch.   Assessment:   1. Porokeratosis   2. 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 -Discussed corns and calluses with patient and treatment options.  -Hyperkeratotic tissue was debrided with chisel without incident as courtesy.  -Applied salycylic acid treatment to area with dressing. Advised to remove bandaging tomorrow.  -Encouraged daily moisturizing -Discussed use of pumice stone -Advised good supportive shoes and inserts -X-rays reviewed. Bilateral hammered second and third digits.  -Educated on hammertoes and treatment options  -Discussed  padding including toe caps and crest pads.  -Handicap sticker provided.  -Patient discussed holding off on surgery for now as concern for recovery and being at home along.     Louann Sjogren, DPM

## 2022-05-14 DIAGNOSIS — L218 Other seborrheic dermatitis: Secondary | ICD-10-CM | POA: Diagnosis not present

## 2022-05-14 DIAGNOSIS — L820 Inflamed seborrheic keratosis: Secondary | ICD-10-CM | POA: Diagnosis not present

## 2022-05-27 DIAGNOSIS — E7800 Pure hypercholesterolemia, unspecified: Secondary | ICD-10-CM | POA: Diagnosis not present

## 2022-05-27 DIAGNOSIS — M109 Gout, unspecified: Secondary | ICD-10-CM | POA: Diagnosis not present

## 2022-05-27 DIAGNOSIS — Z79899 Other long term (current) drug therapy: Secondary | ICD-10-CM | POA: Diagnosis not present

## 2022-05-27 DIAGNOSIS — R7301 Impaired fasting glucose: Secondary | ICD-10-CM | POA: Diagnosis not present

## 2022-05-27 DIAGNOSIS — E559 Vitamin D deficiency, unspecified: Secondary | ICD-10-CM | POA: Diagnosis not present

## 2022-05-29 DIAGNOSIS — R7301 Impaired fasting glucose: Secondary | ICD-10-CM | POA: Diagnosis not present

## 2022-05-29 DIAGNOSIS — Z79899 Other long term (current) drug therapy: Secondary | ICD-10-CM | POA: Diagnosis not present

## 2022-05-29 DIAGNOSIS — E559 Vitamin D deficiency, unspecified: Secondary | ICD-10-CM | POA: Diagnosis not present

## 2022-05-29 DIAGNOSIS — Z0001 Encounter for general adult medical examination with abnormal findings: Secondary | ICD-10-CM | POA: Diagnosis not present

## 2022-05-29 DIAGNOSIS — R42 Dizziness and giddiness: Secondary | ICD-10-CM | POA: Diagnosis not present

## 2022-05-29 DIAGNOSIS — E7800 Pure hypercholesterolemia, unspecified: Secondary | ICD-10-CM | POA: Diagnosis not present

## 2022-05-29 DIAGNOSIS — M109 Gout, unspecified: Secondary | ICD-10-CM | POA: Diagnosis not present

## 2022-05-29 DIAGNOSIS — R519 Headache, unspecified: Secondary | ICD-10-CM | POA: Diagnosis not present

## 2022-06-16 DIAGNOSIS — R519 Headache, unspecified: Secondary | ICD-10-CM | POA: Diagnosis not present

## 2022-06-16 DIAGNOSIS — R9082 White matter disease, unspecified: Secondary | ICD-10-CM | POA: Diagnosis not present

## 2022-06-16 DIAGNOSIS — R42 Dizziness and giddiness: Secondary | ICD-10-CM | POA: Diagnosis not present

## 2022-08-06 DIAGNOSIS — L218 Other seborrheic dermatitis: Secondary | ICD-10-CM | POA: Diagnosis not present

## 2022-08-06 DIAGNOSIS — L820 Inflamed seborrheic keratosis: Secondary | ICD-10-CM | POA: Diagnosis not present

## 2022-10-02 DIAGNOSIS — Z1231 Encounter for screening mammogram for malignant neoplasm of breast: Secondary | ICD-10-CM | POA: Diagnosis not present

## 2022-10-21 DIAGNOSIS — N958 Other specified menopausal and perimenopausal disorders: Secondary | ICD-10-CM | POA: Diagnosis not present

## 2022-11-17 DIAGNOSIS — M79672 Pain in left foot: Secondary | ICD-10-CM | POA: Diagnosis not present

## 2022-11-18 DIAGNOSIS — R7301 Impaired fasting glucose: Secondary | ICD-10-CM | POA: Diagnosis not present

## 2022-11-18 DIAGNOSIS — E559 Vitamin D deficiency, unspecified: Secondary | ICD-10-CM | POA: Diagnosis not present

## 2022-11-18 DIAGNOSIS — Z79899 Other long term (current) drug therapy: Secondary | ICD-10-CM | POA: Diagnosis not present

## 2022-11-18 DIAGNOSIS — E7800 Pure hypercholesterolemia, unspecified: Secondary | ICD-10-CM | POA: Diagnosis not present

## 2022-11-18 DIAGNOSIS — M109 Gout, unspecified: Secondary | ICD-10-CM | POA: Diagnosis not present

## 2022-11-18 LAB — COMPREHENSIVE METABOLIC PANEL
Calcium: 9.8
EGFR: 76

## 2022-11-20 DIAGNOSIS — M109 Gout, unspecified: Secondary | ICD-10-CM | POA: Diagnosis not present

## 2022-11-20 DIAGNOSIS — R7301 Impaired fasting glucose: Secondary | ICD-10-CM | POA: Diagnosis not present

## 2022-11-20 DIAGNOSIS — E559 Vitamin D deficiency, unspecified: Secondary | ICD-10-CM | POA: Diagnosis not present

## 2022-11-20 DIAGNOSIS — Z79899 Other long term (current) drug therapy: Secondary | ICD-10-CM | POA: Diagnosis not present

## 2022-11-20 DIAGNOSIS — E7800 Pure hypercholesterolemia, unspecified: Secondary | ICD-10-CM | POA: Diagnosis not present

## 2022-12-10 ENCOUNTER — Other Ambulatory Visit (HOSPITAL_COMMUNITY): Payer: Self-pay | Admitting: Family Medicine

## 2022-12-10 DIAGNOSIS — E7841 Elevated Lipoprotein(a): Secondary | ICD-10-CM

## 2022-12-10 DIAGNOSIS — E7800 Pure hypercholesterolemia, unspecified: Secondary | ICD-10-CM

## 2023-01-06 DIAGNOSIS — H5213 Myopia, bilateral: Secondary | ICD-10-CM | POA: Diagnosis not present

## 2023-01-06 DIAGNOSIS — Z961 Presence of intraocular lens: Secondary | ICD-10-CM | POA: Diagnosis not present

## 2023-01-23 ENCOUNTER — Ambulatory Visit (HOSPITAL_BASED_OUTPATIENT_CLINIC_OR_DEPARTMENT_OTHER)
Admission: RE | Admit: 2023-01-23 | Discharge: 2023-01-23 | Disposition: A | Discharge status: Home / Self Care | Payer: Self-pay | Source: Ambulatory Visit | Attending: Family Medicine | Admitting: Family Medicine

## 2023-01-23 DIAGNOSIS — E7800 Pure hypercholesterolemia, unspecified: Secondary | ICD-10-CM | POA: Insufficient documentation

## 2023-01-23 DIAGNOSIS — E7841 Elevated Lipoprotein(a): Secondary | ICD-10-CM | POA: Insufficient documentation

## 2023-04-13 NOTE — Progress Notes (Unsigned)
  Cardiology Office Note:  .   Date:  04/14/2023  ID:  Rachel Brewer, DOB [birth year], MRN [id number] PCP: Darrow Bussing, MD  [facility] Providers Cardiologist:  None {   History of Present Illness: .   Rachel Brewer is a 73 y.o. female with coronary calcifications She is the wife of a former patient of mine Sales executive who passed away of a brain cancer )   Coronary calcium score on Dec. 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 ( from her primary MD  Had the coronary calcium scan following that level was found   Has been on rosuvastatin 5 mg a day for years   No CP , no dyspnea Works out with a Psychologist, educational , does lots of cardio  No CP with her cardio work outs   She has lost 47 lbs on a program [employer] Weight loss )     Family hx   Father had valvular heart disease , died at age 35 before he could have his valve replacement    ROS:    Studies Reviewed: Marland Kitchen   EKG Interpretation Date/Time:  Tuesday April 14 2023 15:50:43 EDT Ventricular Rate:  74 PR Interval:  158 QRS Duration:  82 QT Interval:  380 QTC Calculation: 421 R Axis:   107  Text Interpretation: Normal sinus rhythm Rightward axis Low voltage QRS Cannot rule out Anterior infarct , age undetermined , poor R wave progression , likely lead placement When compared with ECG of 27-Apr-2013 09:12, QRS axis Shifted right Confirmed by Kristeen Miss ([id number]) on 04/14/2023 3:56:22 PM      Risk Assessment/Calculations:             Physical Exam:   VS:  BP 134/82   Pulse 85   Ht 5' 8.5" (1.74 m)   Wt 288 lb 12.8 oz (131 kg)   SpO2 97%   BMI 43.27 kg/m    Wt Readings from Last 3 Encounters:  04/14/23 288 lb 12.8 oz (131 kg)  08/18/18 (!) 315 lb (142.9 kg)  03/09/15 (!) 315 lb (142.9 kg)    GEN: Well nourished, well developed in no acute distress NECK: No JVD; No carotid bruits CARDIAC: RRR, no murmurs, rubs, gallops RESPIRATORY:  Clear to  auscultation without rales, wheezing or rhonchi  ABDOMEN: Soft, non-tender, non-distended EXTREMITIES:  No edema; No deformity   ASSESSMENT AND PLAN: .     CAD: she has an elevated LP(a)  Her last LDL is 71.  She is on rosuvastatin 5 mg a day.  She is looking into enrolling in the study by [employer] that addresses elevated LP(a).  We will increase her rosuvastatin to 10 mg a day.  I will check labs in 3 months.  She would like to ask around and then give me a request for who she might see in follow-up in 6 months.        Dispo: 6 months    Signed, Kristeen Miss, MD

## 2023-04-14 ENCOUNTER — Ambulatory Visit: Payer: Medicare Other | Attending: Cardiovascular Disease | Admitting: Cardiovascular Disease

## 2023-04-14 ENCOUNTER — Encounter: Payer: Self-pay | Admitting: Cardiovascular Disease

## 2023-04-14 VITALS — BP 134/82 | HR 85 | Ht 68.5 in | Wt 288.8 lb

## 2023-04-14 DIAGNOSIS — Z79899 Other long term (current) drug therapy: Secondary | ICD-10-CM | POA: Insufficient documentation

## 2023-04-14 DIAGNOSIS — Z0000 Encounter for general adult medical examination without abnormal findings: Secondary | ICD-10-CM | POA: Diagnosis not present

## 2023-04-14 DIAGNOSIS — E782 Mixed hyperlipidemia: Secondary | ICD-10-CM | POA: Diagnosis not present

## 2023-04-14 MED ORDER — ASPIRIN 81 MG PO TBEC: 81.0000 mg | DELAYED_RELEASE_TABLET | Freq: Every day | ORAL | Status: DC

## 2023-04-14 MED ORDER — ROSUVASTATIN CALCIUM 10 MG PO TABS: 10.0000 mg | ORAL_TABLET | Freq: Every day | ORAL | 3 refills | Status: DC

## 2023-04-14 NOTE — Patient Instructions (Signed)
 Medication Instructions:  INCREASE Crestor/Rosuvastatin to 10mg  daily START Aspirin 81mg  daily *If you need a refill on your cardiac medications before your next appointment, please call your pharmacy*   Lab Work: Lipids, ALT, BMET in 3 months If you have labs (blood work) drawn today and your tests are completely normal, you will receive your results only by: MyChart Message (if you have MyChart) OR A paper copy in the mail If you have any lab test that is abnormal or we need to change your treatment, we will call you to review the results.  Follow-Up: At [facility], you and your health needs are our priority.  As part of our continuing mission to provide you with exceptional heart care, we have created designated Provider Care Teams.  These Care Teams include your primary Cardiologist (physician) and Advanced Practice Providers (APPs -  Physician Assistants and Nurse Practitioners) who all work together to provide you with the care you need, when you need it.  Your next appointment:   6 month(s)  Provider:   APP     1st Floor: - Lobby - Registration  - Pharmacy  - Lab - Cafe  2nd Floor: - PV Lab - Diagnostic Testing (echo, CT, nuclear med)  3rd Floor: - Vacant  4th Floor: - TCTS (cardiothoracic surgery) - AFib Clinic - Structural Heart Clinic - Vascular Surgery  - Vascular Ultrasound  5th Floor: - HeartCare Cardiology (general and EP) - Clinical Pharmacy for coumadin, hypertension, lipid, weight-loss medications, and med management appointments    Valet parking services will be available as well.

## 2023-06-01 DIAGNOSIS — H01001 Unspecified blepharitis right upper eyelid: Secondary | ICD-10-CM | POA: Diagnosis not present

## 2023-06-17 DIAGNOSIS — M109 Gout, unspecified: Secondary | ICD-10-CM | POA: Diagnosis not present

## 2023-06-17 DIAGNOSIS — E7800 Pure hypercholesterolemia, unspecified: Secondary | ICD-10-CM | POA: Diagnosis not present

## 2023-06-17 DIAGNOSIS — R7301 Impaired fasting glucose: Secondary | ICD-10-CM | POA: Diagnosis not present

## 2023-06-17 DIAGNOSIS — E559 Vitamin D deficiency, unspecified: Secondary | ICD-10-CM | POA: Diagnosis not present

## 2023-06-17 DIAGNOSIS — Z79899 Other long term (current) drug therapy: Secondary | ICD-10-CM | POA: Diagnosis not present

## 2023-06-18 DIAGNOSIS — L820 Inflamed seborrheic keratosis: Secondary | ICD-10-CM | POA: Diagnosis not present

## 2023-06-18 DIAGNOSIS — L603 Nail dystrophy: Secondary | ICD-10-CM | POA: Diagnosis not present

## 2023-06-18 DIAGNOSIS — L720 Epidermal cyst: Secondary | ICD-10-CM | POA: Diagnosis not present

## 2023-06-18 DIAGNOSIS — L858 Other specified epidermal thickening: Secondary | ICD-10-CM | POA: Diagnosis not present

## 2023-06-19 DIAGNOSIS — R7301 Impaired fasting glucose: Secondary | ICD-10-CM | POA: Diagnosis not present

## 2023-06-19 DIAGNOSIS — E7800 Pure hypercholesterolemia, unspecified: Secondary | ICD-10-CM | POA: Diagnosis not present

## 2023-06-19 DIAGNOSIS — Z23 Encounter for immunization: Secondary | ICD-10-CM | POA: Diagnosis not present

## 2023-06-19 DIAGNOSIS — Z0001 Encounter for general adult medical examination with abnormal findings: Secondary | ICD-10-CM | POA: Diagnosis not present

## 2023-06-19 DIAGNOSIS — E559 Vitamin D deficiency, unspecified: Secondary | ICD-10-CM | POA: Diagnosis not present

## 2023-06-19 DIAGNOSIS — Z79899 Other long term (current) drug therapy: Secondary | ICD-10-CM | POA: Diagnosis not present

## 2023-06-19 DIAGNOSIS — M109 Gout, unspecified: Secondary | ICD-10-CM | POA: Diagnosis not present

## 2023-07-15 DIAGNOSIS — Z0000 Encounter for general adult medical examination without abnormal findings: Secondary | ICD-10-CM | POA: Diagnosis not present

## 2023-07-15 DIAGNOSIS — Z79899 Other long term (current) drug therapy: Secondary | ICD-10-CM | POA: Diagnosis not present

## 2023-07-15 DIAGNOSIS — E782 Mixed hyperlipidemia: Secondary | ICD-10-CM | POA: Diagnosis not present

## 2023-07-16 ENCOUNTER — Ambulatory Visit: Payer: Self-pay

## 2023-07-16 LAB — ALT: ALT: 12 IU/L (ref 0–32)

## 2023-07-16 LAB — BASIC METABOLIC PANEL WITH GFR
BUN/Creatinine Ratio: 22 (ref 12–28)
BUN: 19 mg/dL (ref 8–27)
CO2: 19 mmol/L — ABNORMAL LOW (ref 20–29)
Calcium: 10 mg/dL (ref 8.7–10.3)
Chloride: 105 mmol/L (ref 96–106)
Creatinine, Ser: 0.86 mg/dL (ref 0.57–1.00)
Glucose: 94 mg/dL (ref 70–99)
Potassium: 4.7 mmol/L (ref 3.5–5.2)
Sodium: 142 mmol/L (ref 134–144)
eGFR: 71 mL/min/{1.73_m2} (ref >59)

## 2023-07-16 LAB — LIPID PANEL
Chol/HDL Ratio: 2.5 ratio (ref 0.0–4.4)
Cholesterol, Total: 147 mg/dL (ref 100–199)
HDL: 58 mg/dL (ref >39)
LDL Chol Calc (NIH): 59 mg/dL (ref 0–99)
Triglycerides: 184 mg/dL — ABNORMAL HIGH (ref 0–149)
VLDL Cholesterol Cal: 30 mg/dL (ref 5–40)

## 2023-08-10 DIAGNOSIS — L905 Scar conditions and fibrosis of skin: Secondary | ICD-10-CM | POA: Diagnosis not present

## 2023-08-10 DIAGNOSIS — D2239 Melanocytic nevi of other parts of face: Secondary | ICD-10-CM | POA: Diagnosis not present

## 2023-08-10 DIAGNOSIS — L821 Other seborrheic keratosis: Secondary | ICD-10-CM | POA: Diagnosis not present

## 2023-08-10 DIAGNOSIS — D485 Neoplasm of uncertain behavior of skin: Secondary | ICD-10-CM | POA: Diagnosis not present

## 2023-08-14 DIAGNOSIS — M545 Low back pain, unspecified: Secondary | ICD-10-CM | POA: Diagnosis not present

## 2023-08-14 DIAGNOSIS — M25562 Pain in left knee: Secondary | ICD-10-CM | POA: Diagnosis not present

## 2023-08-26 DIAGNOSIS — M545 Low back pain, unspecified: Secondary | ICD-10-CM | POA: Diagnosis not present

## 2023-09-04 DIAGNOSIS — M545 Low back pain, unspecified: Secondary | ICD-10-CM | POA: Diagnosis not present

## 2023-09-11 DIAGNOSIS — M25562 Pain in left knee: Secondary | ICD-10-CM | POA: Diagnosis not present

## 2023-09-21 DIAGNOSIS — M25562 Pain in left knee: Secondary | ICD-10-CM | POA: Diagnosis not present

## 2023-10-08 DIAGNOSIS — Z1231 Encounter for screening mammogram for malignant neoplasm of breast: Secondary | ICD-10-CM | POA: Diagnosis not present

## 2023-10-14 DIAGNOSIS — M25562 Pain in left knee: Secondary | ICD-10-CM | POA: Diagnosis not present

## 2023-10-23 DIAGNOSIS — M79675 Pain in left toe(s): Secondary | ICD-10-CM | POA: Diagnosis not present

## 2023-10-29 DIAGNOSIS — L84 Corns and callosities: Secondary | ICD-10-CM | POA: Diagnosis not present

## 2023-10-29 DIAGNOSIS — L720 Epidermal cyst: Secondary | ICD-10-CM | POA: Diagnosis not present

## 2023-10-29 DIAGNOSIS — L280 Lichen simplex chronicus: Secondary | ICD-10-CM | POA: Diagnosis not present

## 2023-10-29 DIAGNOSIS — L820 Inflamed seborrheic keratosis: Secondary | ICD-10-CM | POA: Diagnosis not present

## 2023-10-29 DIAGNOSIS — L821 Other seborrheic keratosis: Secondary | ICD-10-CM | POA: Diagnosis not present

## 2023-11-02 ENCOUNTER — Ambulatory Visit: Admitting: Cardiology

## 2023-11-03 ENCOUNTER — Encounter: Payer: Self-pay | Admitting: Cardiology

## 2023-11-03 ENCOUNTER — Ambulatory Visit: Attending: Cardiology | Admitting: Cardiology

## 2023-11-03 VITALS — BP 130/84 | HR 80 | Ht 68.5 in | Wt 300.0 lb

## 2023-11-03 DIAGNOSIS — E782 Mixed hyperlipidemia: Secondary | ICD-10-CM | POA: Insufficient documentation

## 2023-11-03 DIAGNOSIS — I2510 Atherosclerotic heart disease of native coronary artery without angina pectoris: Secondary | ICD-10-CM | POA: Insufficient documentation

## 2023-11-03 DIAGNOSIS — E7841 Elevated Lipoprotein(a): Secondary | ICD-10-CM | POA: Insufficient documentation

## 2023-11-03 NOTE — Progress Notes (Signed)
 Cardiology Office Note:  .   Date:  11/03/2023  ID:  Rachel Brewer, DOB 03/01/1950, MRN [id number] PCP: Regino Slater, MD  [facility] Providers Cardiologist:  Oneil Parchment, MD     History of Present Illness: .   Rachel Brewer is a 73 y.o. female Discussed the use of AI scribe software History of Present Illness Rachel Brewer is a 73 year old female with elevated coronary calcium  score and LP(a) who presents for a follow-up visit (saw Dr. Alveta in the past).  She had a coronary calcium  score of 302 on January 23, 2023, in the 78th percentile. 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 She has been on rosuvastatin  5 mg for many years, which was increased to 10 mg daily, resulting in her LDL decreasing to 59. No current symptoms of angina or other cardiac issues.  She is on colchicine 0.6 mg daily, initially prescribed for gout, which has effectively prevented flare-ups. Additionally, she takes losartan 50 mg daily for hypertension and toprol 50 mg daily. Her blood pressure management has been stable with the current regimen.  She participated in the [place] weight loss program, losing 47 pounds, although she mentioned regaining some weight but is actively managing it again. She exercises regularly, working out with a trainer once a week, focusing on cardio. Her hemoglobin A1c is 6.1, placing her in the prediabetic range, which has been monitored over the years.  Her family history includes a significant cardiac event involving her sister's husband, who had a massive heart attack. This history prompted her to pursue further cardiac evaluation. She quit smoking many years ago after smoking for 17.5 years. She does not currently smoke. No symptoms currently.  Employed at [employer]   Studies Reviewed: .        Results LABS Lipoprotein(a): 209 Low-Density Lipoprotein (LDL): 59 Hemoglobin A1c (HbA1c): 6.1  RADIOLOGY Coronary calcium  score: 302 (01/23/2023) Risk  Assessment/Calculations:            Physical Exam:   VS:  BP 130/84   Pulse 80   Ht 5' 8.5 (1.74 m)   Wt 300 lb (136.1 kg)   SpO2 98%   BMI 44.95 kg/m    Wt Readings from Last 3 Encounters:  11/03/23 300 lb (136.1 kg)  04/14/23 288 lb 12.8 oz (131 kg)  08/18/18 (!) 315 lb (142.9 kg)    GEN: Well nourished, well developed in no acute distress NECK: No JVD; No carotid bruits CARDIAC: RRR, no murmurs, no rubs, no gallops RESPIRATORY:  Clear to auscultation without rales, wheezing or rhonchi  ABDOMEN: Soft, non-tender, non-distended EXTREMITIES:  No edema; No deformity   ASSESSMENT AND PLAN: .    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 No current symptoms. Risk factors include elevated coronary calcium  score and lipoprotein(a). Current management includes lifestyle modifications and medications to reduce cardiovascular risk. Colchicine is used for gout but also provides cardiac risk reduction benefits. - Continue rosuvastatin  10 mg daily - Continue aspirin  81 mg daily - Continue colchicine 0.6 mg daily - Encourage ongoing lifestyle modifications including weight loss and exercise  Hyperlipidemia Hyperlipidemia managed with rosuvastatin , which has successfully reduced LDL to 59, achieving target goals. - Continue rosuvastatin  10 mg daily  Hypertension Hypertension managed with losartan 50 mg daily and toprol 50  mg daily. Blood pressure control is well-managed with current regimen. - Continue losartan 50 mg daily - Continue toprol 50 mg daily  Prediabetes (impaired glucose tolerance) Prediabetes with hemoglobin A1c at 6.1, indicating impaired glucose tolerance. The increase in rosuvastatin  may have contributed to a slight increase in blood sugar  levels. Monitoring by her primary care physician is ongoing. - Monitor hemoglobin A1c and blood glucose levels regularly  Gout Gout managed with colchicine 0.6 mg daily, which has effectively prevented flare-ups. No adverse effects reported from colchicine use. - Continue colchicine 0.6 mg daily      1 yr  Signed, Oneil Parchment, MD

## 2023-11-03 NOTE — Patient Instructions (Signed)

## 2023-11-09 ENCOUNTER — Encounter (HOSPITAL_BASED_OUTPATIENT_CLINIC_OR_DEPARTMENT_OTHER): Payer: Self-pay | Admitting: Urology

## 2023-11-09 ENCOUNTER — Emergency Department (HOSPITAL_BASED_OUTPATIENT_CLINIC_OR_DEPARTMENT_OTHER)
Admission: EM | Admit: 2023-11-09 | Discharge: 2023-11-09 | Disposition: A | Discharge status: Home / Self Care | Attending: Emergency Medicine | Admitting: Emergency Medicine

## 2023-11-09 ENCOUNTER — Other Ambulatory Visit: Payer: Self-pay

## 2023-11-09 ENCOUNTER — Emergency Department (HOSPITAL_BASED_OUTPATIENT_CLINIC_OR_DEPARTMENT_OTHER)

## 2023-11-09 DIAGNOSIS — Z7901 Long term (current) use of anticoagulants: Secondary | ICD-10-CM | POA: Diagnosis not present

## 2023-11-09 DIAGNOSIS — I82492 Acute embolism and thrombosis of other specified deep vein of left lower extremity: Secondary | ICD-10-CM | POA: Insufficient documentation

## 2023-11-09 DIAGNOSIS — I82402 Acute embolism and thrombosis of unspecified deep veins of left lower extremity: Secondary | ICD-10-CM | POA: Diagnosis not present

## 2023-11-09 DIAGNOSIS — M79606 Pain in leg, unspecified: Secondary | ICD-10-CM | POA: Diagnosis not present

## 2023-11-09 DIAGNOSIS — I824Y2 Acute embolism and thrombosis of unspecified deep veins of left proximal lower extremity: Secondary | ICD-10-CM | POA: Diagnosis not present

## 2023-11-09 DIAGNOSIS — M7989 Other specified soft tissue disorders: Secondary | ICD-10-CM | POA: Diagnosis present

## 2023-11-09 DIAGNOSIS — I82462 Acute embolism and thrombosis of left calf muscular vein: Secondary | ICD-10-CM | POA: Diagnosis not present

## 2023-11-09 DIAGNOSIS — I8002 Phlebitis and thrombophlebitis of superficial vessels of left lower extremity: Secondary | ICD-10-CM | POA: Diagnosis not present

## 2023-11-09 LAB — CBC WITH DIFFERENTIAL/PLATELET
Abs Immature Granulocytes: 0.04 K/uL (ref 0.00–0.07)
Basophils Absolute: 0 K/uL (ref 0.0–0.1)
Basophils Relative: 0 %
Eosinophils Absolute: 0.2 K/uL (ref 0.0–0.5)
Eosinophils Relative: 2 %
HCT: 38.7 % (ref 36.0–46.0)
Hemoglobin: 12.7 g/dL (ref 12.0–15.0)
Immature Granulocytes: 0 %
Lymphocytes Relative: 31 %
Lymphs Abs: 2.9 K/uL (ref 0.7–4.0)
MCH: 26.6 pg (ref 26.0–34.0)
MCHC: 32.8 g/dL (ref 30.0–36.0)
MCV: 81.1 fL (ref 80.0–100.0)
Monocytes Absolute: 0.8 K/uL (ref 0.1–1.0)
Monocytes Relative: 9 %
Neutro Abs: 5.5 K/uL (ref 1.7–7.7)
Neutrophils Relative %: 58 %
Platelets: 266 K/uL (ref 150–400)
RBC: 4.77 MIL/uL (ref 3.87–5.11)
RDW: 15.5 % (ref 11.5–15.5)
WBC: 9.5 K/uL (ref 4.0–10.5)
nRBC: 0 % (ref 0.0–0.2)

## 2023-11-09 LAB — BASIC METABOLIC PANEL WITH GFR
Anion gap: 13 (ref 5–15)
BUN: 15 mg/dL (ref 8–23)
CO2: 22 mmol/L (ref 22–32)
Calcium: 9.4 mg/dL (ref 8.9–10.3)
Chloride: 105 mmol/L (ref 98–111)
Creatinine, Ser: 0.86 mg/dL (ref 0.44–1.00)
GFR, Estimated: >60 mL/min (ref >60)
Glucose, Bld: 98 mg/dL (ref 70–99)
Potassium: 3.9 mmol/L (ref 3.5–5.1)
Sodium: 140 mmol/L (ref 135–145)

## 2023-11-09 MED ORDER — RIVAROXABAN (XARELTO) VTE STARTER PACK (15 & 20 MG)
ORAL_TABLET | ORAL | 0 refills | Status: AC
  Filled 2023-11-09: qty 51, 30d supply, fill #0

## 2023-11-09 MED ORDER — RIVAROXABAN 15 MG PO TABS
15.0000 mg | ORAL_TABLET | Freq: Once | ORAL | Status: AC
  Administered 2023-11-09: 15 mg via ORAL
  Filled 2023-11-09: qty 1

## 2023-11-09 NOTE — ED Triage Notes (Signed)
 [place]  Noticed left lower leg swelling last night  Dermatology concern for DVT

## 2023-11-09 NOTE — Discharge Instructions (Addendum)
 Stop taking your aspirin .  Start taking the Xarelto.  Pick up your prescription tomorrow.  Make an appointment to follow-up with the DVT clinic.  Return to the emergency room if you have any worsening symptoms.
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
## 2023-11-10 ENCOUNTER — Other Ambulatory Visit (HOSPITAL_BASED_OUTPATIENT_CLINIC_OR_DEPARTMENT_OTHER): Payer: Self-pay

## 2023-11-13 ENCOUNTER — Telehealth (HOSPITAL_COMMUNITY): Payer: Self-pay | Admitting: Emergency Medicine

## 2023-11-13 ENCOUNTER — Encounter (HOSPITAL_COMMUNITY): Payer: Self-pay | Admitting: Emergency Medicine

## 2023-11-17 ENCOUNTER — Other Ambulatory Visit (HOSPITAL_BASED_OUTPATIENT_CLINIC_OR_DEPARTMENT_OTHER): Payer: Self-pay

## 2023-11-17 DIAGNOSIS — E7800 Pure hypercholesterolemia, unspecified: Secondary | ICD-10-CM | POA: Diagnosis not present

## 2023-11-17 DIAGNOSIS — R7301 Impaired fasting glucose: Secondary | ICD-10-CM | POA: Diagnosis not present

## 2023-11-17 DIAGNOSIS — Z79899 Other long term (current) drug therapy: Secondary | ICD-10-CM | POA: Diagnosis not present

## 2023-11-17 DIAGNOSIS — I82402 Acute embolism and thrombosis of unspecified deep veins of left lower extremity: Secondary | ICD-10-CM | POA: Diagnosis not present

## 2023-11-17 DIAGNOSIS — M109 Gout, unspecified: Secondary | ICD-10-CM | POA: Diagnosis not present

## 2023-11-17 MED ORDER — XARELTO 20 MG PO TABS
20.0000 mg | ORAL_TABLET | Freq: Every day | ORAL | 2 refills | Status: AC
  Filled 2023-11-17: qty 30, 30d supply, fill #0
  Filled 2023-12-30: qty 30, 30d supply, fill #1
  Filled 2024-01-20 – 2024-01-22 (×3): qty 30, 30d supply, fill #2

## 2023-11-20 ENCOUNTER — Other Ambulatory Visit (HOSPITAL_BASED_OUTPATIENT_CLINIC_OR_DEPARTMENT_OTHER): Payer: Self-pay

## 2023-11-20 DIAGNOSIS — Z23 Encounter for immunization: Secondary | ICD-10-CM | POA: Diagnosis not present

## 2023-12-22 DIAGNOSIS — E7800 Pure hypercholesterolemia, unspecified: Secondary | ICD-10-CM | POA: Diagnosis not present

## 2023-12-22 DIAGNOSIS — M109 Gout, unspecified: Secondary | ICD-10-CM | POA: Diagnosis not present

## 2023-12-22 DIAGNOSIS — R7301 Impaired fasting glucose: Secondary | ICD-10-CM | POA: Diagnosis not present

## 2023-12-22 DIAGNOSIS — Z79899 Other long term (current) drug therapy: Secondary | ICD-10-CM | POA: Diagnosis not present

## 2024-01-12 DIAGNOSIS — D23122 Other benign neoplasm of skin of left lower eyelid, including canthus: Secondary | ICD-10-CM | POA: Diagnosis not present

## 2024-01-12 DIAGNOSIS — Z961 Presence of intraocular lens: Secondary | ICD-10-CM | POA: Diagnosis not present

## 2024-01-20 ENCOUNTER — Other Ambulatory Visit (HOSPITAL_BASED_OUTPATIENT_CLINIC_OR_DEPARTMENT_OTHER): Payer: Self-pay

## 2024-01-23 ENCOUNTER — Other Ambulatory Visit (HOSPITAL_BASED_OUTPATIENT_CLINIC_OR_DEPARTMENT_OTHER): Payer: Self-pay

## 2024-02-01 ENCOUNTER — Other Ambulatory Visit: Payer: Self-pay

## 2024-02-01 DIAGNOSIS — Z79899 Other long term (current) drug therapy: Secondary | ICD-10-CM

## 2024-02-01 DIAGNOSIS — E782 Mixed hyperlipidemia: Secondary | ICD-10-CM

## 2024-02-01 DIAGNOSIS — Z0000 Encounter for general adult medical examination without abnormal findings: Secondary | ICD-10-CM

## 2024-02-02 MED ORDER — ROSUVASTATIN CALCIUM 10 MG PO TABS: 10.0000 mg | ORAL_TABLET | Freq: Every day | ORAL | 3 refills | Status: AC
